# Patient Record
Sex: Female | Born: 1970 | Race: White | Hispanic: No | Marital: Married | Smoking: Current some day smoker
Health system: Southern US, Community
[De-identification: ages and names within clinical notes are randomized; demographics above are authoritative.]

## PROBLEM LIST (undated history)

## (undated) DIAGNOSIS — E119 Type 2 diabetes mellitus without complications: Secondary | ICD-10-CM

## (undated) DIAGNOSIS — F1121 Opioid dependence, in remission: Secondary | ICD-10-CM

## (undated) DIAGNOSIS — F319 Bipolar disorder, unspecified: Secondary | ICD-10-CM

## (undated) DIAGNOSIS — H4020X0 Unspecified primary angle-closure glaucoma, stage unspecified: Secondary | ICD-10-CM

## (undated) HISTORY — PX: TONSILLECTOMY: SUR1361

## (undated) HISTORY — PX: ECTOPIC PREGNANCY SURGERY: SHX613

---
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
## 1999-02-03 ENCOUNTER — Emergency Department (HOSPITAL_COMMUNITY): Admission: EM | Admit: 1999-02-03 | Discharge: 1999-02-04 | Payer: Self-pay | Admitting: Emergency Medicine

## 2013-09-10 ENCOUNTER — Emergency Department: Payer: Self-pay | Admitting: Emergency Medicine

## 2013-09-10 DIAGNOSIS — H4020X0 Unspecified primary angle-closure glaucoma, stage unspecified: Secondary | ICD-10-CM

## 2013-09-10 HISTORY — DX: Unspecified primary angle-closure glaucoma, stage unspecified: H40.20X0

## 2013-12-17 DIAGNOSIS — E669 Obesity, unspecified: Secondary | ICD-10-CM | POA: Insufficient documentation

## 2014-01-01 ENCOUNTER — Emergency Department: Payer: Self-pay | Admitting: Emergency Medicine

## 2014-01-04 LAB — BETA STREP CULTURE(ARMC)

## 2014-01-06 ENCOUNTER — Ambulatory Visit: Payer: Self-pay | Admitting: Physician Assistant

## 2014-02-04 ENCOUNTER — Emergency Department: Payer: Self-pay | Admitting: Emergency Medicine

## 2014-04-19 ENCOUNTER — Emergency Department: Payer: Self-pay | Admitting: Emergency Medicine

## 2014-04-19 LAB — URINALYSIS, COMPLETE
Bacteria: NONE SEEN
Bilirubin,UR: NEGATIVE
Blood: NEGATIVE
Glucose,UR: NEGATIVE mg/dL (ref 0–75)
Ketone: NEGATIVE
Leukocyte Esterase: NEGATIVE
NITRITE: NEGATIVE
PROTEIN: NEGATIVE
Ph: 6 (ref 4.5–8.0)
SPECIFIC GRAVITY: 1.018 (ref 1.003–1.030)
Squamous Epithelial: 2

## 2014-04-19 LAB — GC/CHLAMYDIA PROBE AMP

## 2014-04-19 LAB — WET PREP, GENITAL

## 2014-12-13 ENCOUNTER — Emergency Department
Admit: 2014-12-13 | Disposition: A | Discharge status: Home / Self Care | Payer: Self-pay | Admitting: Emergency Medicine

## 2015-07-15 ENCOUNTER — Encounter: Payer: Self-pay | Admitting: Emergency Medicine

## 2015-07-15 ENCOUNTER — Emergency Department
Admission: EM | Admit: 2015-07-15 | Discharge: 2015-07-15 | Disposition: A | Discharge status: Home / Self Care | Payer: Self-pay | Attending: Emergency Medicine | Admitting: Emergency Medicine

## 2015-07-15 DIAGNOSIS — R609 Edema, unspecified: Secondary | ICD-10-CM

## 2015-07-15 DIAGNOSIS — Z880 Allergy status to penicillin: Secondary | ICD-10-CM | POA: Insufficient documentation

## 2015-07-15 DIAGNOSIS — F419 Anxiety disorder, unspecified: Secondary | ICD-10-CM | POA: Insufficient documentation

## 2015-07-15 DIAGNOSIS — R600 Localized edema: Secondary | ICD-10-CM | POA: Insufficient documentation

## 2015-07-15 DIAGNOSIS — Z720 Tobacco use: Secondary | ICD-10-CM | POA: Insufficient documentation

## 2015-07-15 DIAGNOSIS — N390 Urinary tract infection, site not specified: Secondary | ICD-10-CM | POA: Insufficient documentation

## 2015-07-15 LAB — CBC
HEMATOCRIT: 43.9 % (ref 35.0–47.0)
HEMOGLOBIN: 14.3 g/dL (ref 12.0–16.0)
MCH: 28.9 pg (ref 26.0–34.0)
MCHC: 32.6 g/dL (ref 32.0–36.0)
MCV: 88.8 fL (ref 80.0–100.0)
Platelets: 192 10*3/uL (ref 150–440)
RBC: 4.95 MIL/uL (ref 3.80–5.20)
RDW: 13.5 % (ref 11.5–14.5)
WBC: 9.1 10*3/uL (ref 3.6–11.0)

## 2015-07-15 LAB — COMPREHENSIVE METABOLIC PANEL
ALBUMIN: 4.1 g/dL (ref 3.5–5.0)
ALK PHOS: 60 U/L (ref 38–126)
ALT: 156 U/L — AB (ref 14–54)
AST: 68 U/L — ABNORMAL HIGH (ref 15–41)
Anion gap: 4 — ABNORMAL LOW (ref 5–15)
BUN: 14 mg/dL (ref 6–20)
CALCIUM: 9.2 mg/dL (ref 8.9–10.3)
CO2: 24 mmol/L (ref 22–32)
CREATININE: 0.97 mg/dL (ref 0.44–1.00)
Chloride: 110 mmol/L (ref 101–111)
GFR calc Af Amer: >60 mL/min (ref >60)
GFR calc non Af Amer: >60 mL/min (ref >60)
GLUCOSE: 163 mg/dL — AB (ref 65–99)
Potassium: 4.6 mmol/L (ref 3.5–5.1)
SODIUM: 138 mmol/L (ref 135–145)
TOTAL PROTEIN: 7 g/dL (ref 6.5–8.1)
Total Bilirubin: 0.6 mg/dL (ref 0.3–1.2)

## 2015-07-15 LAB — URINALYSIS COMPLETE WITH MICROSCOPIC (ARMC ONLY)
BILIRUBIN URINE: NEGATIVE
Glucose, UA: 50 mg/dL — AB
Hgb urine dipstick: NEGATIVE
KETONES UR: NEGATIVE mg/dL
Leukocytes, UA: NEGATIVE
Nitrite: POSITIVE — AB
PROTEIN: NEGATIVE mg/dL
Specific Gravity, Urine: 1.021 (ref 1.005–1.030)
pH: 5 (ref 5.0–8.0)

## 2015-07-15 MED ORDER — CIPROFLOXACIN HCL 500 MG PO TABS: 500.0000 mg | ORAL_TABLET | Freq: Two times a day (BID) | ORAL | Status: DC

## 2015-07-15 NOTE — ED Provider Notes (Signed)
[facility] Emergency Department Provider Note  ____________________________________________  Time seen: On arrival  I have reviewed the triage vital signs and the nursing notes.   HISTORY  Chief Complaint Swollen hands and foul-smelling urine   HPI Yvonne Torres is a 44 y.o. female who presents with complaints of swelling in her hands that she has noticed intermittently for the last week. She reports her family has a history of kidney disease so she is concerned that her kidneys are failing. She also notes that her urine is dark and has an odor. She denies dysuria but does report frequency. She denies back pain or fevers or chills. No nausea or vomiting. No new medications. Denies drug use. Denies alcohol use.     History reviewed. No pertinent past medical history.  There are no active problems to display for this patient.   History reviewed. No pertinent past surgical history.  Current Outpatient Rx  Name  Route  Sig  Dispense  Refill  . ciprofloxacin (CIPRO) 500 MG tablet   Oral   Take 1 tablet (500 mg total) by mouth 2 (two) times daily.   14 tablet   0     Allergies Penicillins  No family history on file.  Social History Social History  Substance Use Topics  . Smoking status: Current Some Day Smoker  . Smokeless tobacco: None  . Alcohol Use: No    Review of Systems  Constitutional: Negative for fever. Eyes: Negative for visual changes. ENT: Negative for sore throat Cardiovascular: Negative for chest pain. Respiratory: Negative for shortness of breath. Gastrointestinal: Negative for abdominal pain, vomiting and diarrhea. Genitourinary: Negative for dysuria. Positive for foul-smelling urine Musculoskeletal: Negative for back pain. Skin: Negative for rash. Neurological: Negative for headaches or focal weakness Psychiatric: Mild anxiety    ____________________________________________   PHYSICAL EXAM:  VITAL  SIGNS: ED Triage Vitals  Enc Vitals Group     BP 07/15/15 1413 144/98 mmHg     Pulse Rate 07/15/15 1413 98     Resp 07/15/15 1413 18     Temp --      Temp src --      SpO2 07/15/15 1413 98 %     Weight 07/15/15 1413 222 lb (100.699 kg)     Height 07/15/15 1413  (1.626 m)     Head Cir --      Peak Flow --      Pain Score 07/15/15 1414 3     Pain Loc --      Pain Edu? --      Excl. in GC? --      Constitutional: Alert and oriented. Well appearing and in no distress. Eyes: Conjunctivae are normal.  ENT   Head: Normocephalic and atraumatic.   Mouth/Throat: Mucous membranes are moist. Cardiovascular: Normal rate, regular rhythm. Normal and symmetric distal pulses are present in all extremities. No murmurs, rubs, or gallops. Respiratory: Normal respiratory effort without tachypnea nor retractions. Breath sounds are clear and equal bilaterally.  Gastrointestinal: Soft and non-tender in all quadrants. No distention. There is no CVA tenderness. Genitourinary: deferred Musculoskeletal: Nontender with normal range of motion in all extremities. No upper or lower extremity edema noted. Hands appear normal to me Neurologic:  Normal speech and language. No gross focal neurologic deficits are appreciated. Skin:  Skin is warm, dry and intact. No rash noted. Psychiatric: Mood and affect are normal. Patient exhibits appropriate insight and judgment.  ____________________________________________    LABS (pertinent positives/negatives)  Labs Reviewed  URINALYSIS COMPLETEWITH MICROSCOPIC ([facility] ONLY) - Abnormal; Notable for the following:    Color, Urine YELLOW (*)    APPearance HAZY (*)    Glucose, UA 50 (*)    Nitrite POSITIVE (*)    Bacteria, UA RARE (*)    Squamous Epithelial / LPF 6-30 (*)    All other components within normal limits  COMPREHENSIVE METABOLIC PANEL - Abnormal; Notable for the following:    Glucose, Bld 163 (*)    AST 68 (*)    ALT 156 (*)    Anion gap 4  (*)    All other components within normal limits  CBC    ____________________________________________   EKG  None  ____________________________________________    RADIOLOGY I have personally reviewed any xrays that were ordered on this patient: None  ____________________________________________   PROCEDURES  Procedure(s) performed: none  Critical Care performed:none  ____________________________________________   INITIAL IMPRESSION / ASSESSMENT AND PLAN / ED COURSE  Pertinent labs & imaging results that were available during my care of the patient were reviewed by me and considered in my medical decision making (see chart for details).  Renal function unremarkable. Positive nitrites on urinalysis which we will treat with antibiotics. I do not see any edema but I recommended decrease salt intake and PCP follow-up.  ____________________________________________   FINAL CLINICAL IMPRESSION(S) / ED DIAGNOSES  Final diagnoses:  UTI (lower urinary tract infection)  Peripheral edema     Jene Everyobert Alice Vitelli, MD 07/15/15 2300

## 2015-07-15 NOTE — ED Notes (Signed)
Pt presents with swollen hands and dark urine with an odor.

## 2015-07-15 NOTE — Discharge Instructions (Signed)

## 2015-09-21 ENCOUNTER — Emergency Department: Payer: Self-pay

## 2015-09-21 ENCOUNTER — Emergency Department
Admission: EM | Admit: 2015-09-21 | Discharge: 2015-09-21 | Disposition: A | Discharge status: Home / Self Care | Payer: Self-pay | Attending: Emergency Medicine | Admitting: Emergency Medicine

## 2015-09-21 DIAGNOSIS — J209 Acute bronchitis, unspecified: Secondary | ICD-10-CM | POA: Insufficient documentation

## 2015-09-21 DIAGNOSIS — F17200 Nicotine dependence, unspecified, uncomplicated: Secondary | ICD-10-CM | POA: Insufficient documentation

## 2015-09-21 DIAGNOSIS — Z880 Allergy status to penicillin: Secondary | ICD-10-CM | POA: Insufficient documentation

## 2015-09-21 LAB — CBC
HEMATOCRIT: 42.4 % (ref 35.0–47.0)
HEMOGLOBIN: 13.8 g/dL (ref 12.0–16.0)
MCH: 28.2 pg (ref 26.0–34.0)
MCHC: 32.6 g/dL (ref 32.0–36.0)
MCV: 86.7 fL (ref 80.0–100.0)
Platelets: 216 10*3/uL (ref 150–440)
RBC: 4.89 MIL/uL (ref 3.80–5.20)
RDW: 13.2 % (ref 11.5–14.5)
WBC: 11.7 10*3/uL — AB (ref 3.6–11.0)

## 2015-09-21 LAB — BASIC METABOLIC PANEL
ANION GAP: 6 (ref 5–15)
BUN: 12 mg/dL (ref 6–20)
CALCIUM: 9.1 mg/dL (ref 8.9–10.3)
CO2: 23 mmol/L (ref 22–32)
Chloride: 109 mmol/L (ref 101–111)
Creatinine, Ser: 0.9 mg/dL (ref 0.44–1.00)
GLUCOSE: 141 mg/dL — AB (ref 65–99)
POTASSIUM: 3.7 mmol/L (ref 3.5–5.1)
Sodium: 138 mmol/L (ref 135–145)

## 2015-09-21 LAB — TROPONIN I

## 2015-09-21 MED ORDER — AZITHROMYCIN 250 MG PO TABS: ORAL_TABLET | ORAL | Status: DC

## 2015-09-21 MED ORDER — PREDNISONE 20 MG PO TABS
60.0000 mg | ORAL_TABLET | Freq: Once | ORAL | Status: AC
  Administered 2015-09-21: 60 mg via ORAL

## 2015-09-21 MED ORDER — HYDROCOD POLST-CPM POLST ER 10-8 MG/5ML PO SUER
5.0000 mL | Freq: Once | ORAL | Status: AC
  Administered 2015-09-21: 5 mL via ORAL

## 2015-09-21 MED ORDER — IPRATROPIUM-ALBUTEROL 0.5-2.5 (3) MG/3ML IN SOLN
RESPIRATORY_TRACT | Status: AC
  Administered 2015-09-21: 3 mL via RESPIRATORY_TRACT
  Filled 2015-09-21: qty 3

## 2015-09-21 MED ORDER — PREDNISONE 20 MG PO TABS
ORAL_TABLET | ORAL | Status: AC
  Administered 2015-09-21: 60 mg via ORAL
  Filled 2015-09-21: qty 3

## 2015-09-21 MED ORDER — IPRATROPIUM-ALBUTEROL 0.5-2.5 (3) MG/3ML IN SOLN
3.0000 mL | Freq: Once | RESPIRATORY_TRACT | Status: AC
  Administered 2015-09-21: 3 mL via RESPIRATORY_TRACT
  Filled 2015-09-21: qty 3

## 2015-09-21 MED ORDER — HYDROCOD POLST-CPM POLST ER 10-8 MG/5ML PO SUER
ORAL | Status: AC
  Administered 2015-09-21: 5 mL via ORAL
  Filled 2015-09-21: qty 5

## 2015-09-21 MED ORDER — ALBUTEROL SULFATE 0.63 MG/3ML IN NEBU: 1.0000 | INHALATION_SOLUTION | Freq: Four times a day (QID) | RESPIRATORY_TRACT | Status: DC | PRN

## 2015-09-21 MED ORDER — BENZONATATE 200 MG PO CAPS: 200.0000 mg | ORAL_CAPSULE | Freq: Three times a day (TID) | ORAL | Status: DC | PRN

## 2015-09-21 MED ORDER — PREDNISONE 10 MG (21) PO TBPK: 10.0000 mg | ORAL_TABLET | Freq: Every day | ORAL | Status: DC

## 2015-09-21 NOTE — ED Notes (Addendum)
Pt appears anxious, pt admits that she is anxious.

## 2015-09-21 NOTE — ED Provider Notes (Signed)
[facility] Emergency Department Provider Note     Time seen: ----------------------------------------- 4:35 PM on 09/21/2015 -----------------------------------------    I have reviewed the triage vital signs and the nursing notes.   HISTORY  Chief Complaint Shortness of Breath    HPI Yvonne Torres is a 45 y.o. female who presents to ER with shortness of breath since she inhaled some papers from an [employer], operational. Patient states shortness of breath has worsened over the last 3-4 days and she has increased cough with green sputum production. She denies any fevers chills but does have diarrhea.   History reviewed. No pertinent past medical history.  There are no active problems to display for this patient.   History reviewed. No pertinent past surgical history.  Allergies Penicillins  Social History Social History  Substance Use Topics  . Smoking status: Current Some Day Smoker  . Smokeless tobacco: None  . Alcohol Use: No    Review of Systems Constitutional: Negative for fever. Eyes: Negative for visual changes. ENT: Negative for sore throat. Cardiovascular: Negative for chest pain. Respiratory: Positive for shortness of breath and cough Gastrointestinal: Negative for abdominal pain, vomiting, positive for diarrhea Genitourinary: Negative for dysuria. Musculoskeletal: Negative for back pain. Skin: Negative for rash. Neurological: Negative for headaches, focal weakness or numbness.  10-point ROS otherwise negative.  ____________________________________________   PHYSICAL EXAM:  VITAL SIGNS: ED Triage Vitals  Enc Vitals Group     BP 09/21/15 1600 140/85 mmHg     Pulse Rate 09/21/15 1600 109     Resp 09/21/15 1600 22     Temp 09/21/15 1600 98.5 F (36.9 C)     Temp Source 09/21/15 1600 Oral     SpO2 09/21/15 1600 98 %     Weight 09/21/15 1600 220 lb (99.791 kg)     Height 09/21/15 1600 5\' 4"  (1.626 m)     Head  Cir --      Peak Flow --      Pain Score 09/21/15 1601 4     Pain Loc --      Pain Edu? --      Excl. in GC? --     Constitutional: Alert and oriented. Well appearing and in no distress. Eyes: Conjunctivae are normal. PERRL. Normal extraocular movements. ENT   Head: Normocephalic and atraumatic.   Nose: No congestion/rhinnorhea.   Mouth/Throat: Mucous membranes are moist.   Neck: No stridor. Cardiovascular: Normal rate, regular rhythm. Normal and symmetric distal pulses are present in all extremities. No murmurs, rubs, or gallops. Respiratory: Normal respiratory effort without tachypnea nor retractions. Mild wheezing and rhonchi bilaterally. Gastrointestinal: Soft and nontender. No distention. No abdominal bruits.  Musculoskeletal: Nontender with normal range of motion in all extremities. No joint effusions.  No lower extremity tenderness nor edema. Neurologic:  Normal speech and language. No gross focal neurologic deficits are appreciated. Speech is normal. No gait instability. Skin:  Skin is warm, dry and intact. No rash noted. Psychiatric: Mood and affect are normal. Speech and behavior are normal. Patient exhibits appropriate insight and judgment. ____________________________________________  EKG: Interpreted by me. Sinus tachycardia with rate of 109 bpm, normal PR interval, normal QRS, normal QT interval. Normal axis.  ____________________________________________  ED COURSE:  Pertinent labs & imaging results that were available during my care of the patient were reviewed by me and considered in my medical decision making (see chart for details). Patient is in no acute distress, will check basic labs, chest x-ray and give  a DuoNeb. ____________________________________________    LABS (pertinent positives/negatives)  Labs Reviewed  CBC - Abnormal; Notable for the following:    WBC 11.7 (*)    All other components within normal limits  BASIC METABOLIC PANEL -  Abnormal; Notable for the following:    Glucose, Bld 141 (*)    All other components within normal limits  TROPONIN I    RADIOLOGY  Chest x-ray  IMPRESSION: No active cardiopulmonary disease. ____________________________________________  FINAL ASSESSMENT AND PLAN  Bronchitis  Plan: Patient with labs and imaging as dictated above. Patient with increased wheezing after breathing treatment here. She'll be discharged with albuterol solution as she has a nebulizer machine at home, should be on a steroid taper and given oral antibiotics. She is stable for outpatient follow-up with her doctor.   Emily FilbertWilliams, Jonathan E, MD   Emily FilbertJonathan E Williams, MD 09/21/15 [phone]

## 2015-09-21 NOTE — ED Notes (Signed)
Pt arrives c/o SOB since she had her oil changed 09/05/15. Pt states SOB worsening X 3-4 days. Pt productive cough, green. Denies fever. Pt has not been seen by a doctor. Pt alert and oriented X4, active, cooperative, pt in NAD. RR even and unlabored, color WNL.

## 2015-09-21 NOTE — Discharge Instructions (Signed)

## 2015-09-23 ENCOUNTER — Encounter: Payer: Self-pay | Admitting: Emergency Medicine

## 2015-09-23 ENCOUNTER — Emergency Department
Admission: EM | Admit: 2015-09-23 | Discharge: 2015-09-23 | Disposition: A | Discharge status: Home / Self Care | Payer: Self-pay | Attending: Emergency Medicine | Admitting: Emergency Medicine

## 2015-09-23 DIAGNOSIS — F17210 Nicotine dependence, cigarettes, uncomplicated: Secondary | ICD-10-CM | POA: Insufficient documentation

## 2015-09-23 DIAGNOSIS — T483X5A Adverse effect of antitussives, initial encounter: Secondary | ICD-10-CM | POA: Insufficient documentation

## 2015-09-23 DIAGNOSIS — J209 Acute bronchitis, unspecified: Secondary | ICD-10-CM | POA: Insufficient documentation

## 2015-09-23 DIAGNOSIS — J40 Bronchitis, not specified as acute or chronic: Secondary | ICD-10-CM

## 2015-09-23 DIAGNOSIS — L500 Allergic urticaria: Secondary | ICD-10-CM | POA: Insufficient documentation

## 2015-09-23 DIAGNOSIS — Z79899 Other long term (current) drug therapy: Secondary | ICD-10-CM | POA: Insufficient documentation

## 2015-09-23 DIAGNOSIS — Z7952 Long term (current) use of systemic steroids: Secondary | ICD-10-CM | POA: Insufficient documentation

## 2015-09-23 DIAGNOSIS — Z792 Long term (current) use of antibiotics: Secondary | ICD-10-CM | POA: Insufficient documentation

## 2015-09-23 DIAGNOSIS — I10 Essential (primary) hypertension: Secondary | ICD-10-CM | POA: Insufficient documentation

## 2015-09-23 DIAGNOSIS — T50905A Adverse effect of unspecified drugs, medicaments and biological substances, initial encounter: Secondary | ICD-10-CM

## 2015-09-23 MED ORDER — EPINEPHRINE 0.3 MG/0.3ML IJ SOAJ: 0.3000 mg | Freq: Once | INTRAMUSCULAR | Status: AC

## 2015-09-23 MED ORDER — PREDNISONE 20 MG PO TABS
60.0000 mg | ORAL_TABLET | Freq: Once | ORAL | Status: AC
  Administered 2015-09-23: 60 mg via ORAL
  Filled 2015-09-23: qty 3

## 2015-09-23 MED ORDER — IPRATROPIUM-ALBUTEROL 0.5-2.5 (3) MG/3ML IN SOLN
9.0000 mL | Freq: Once | RESPIRATORY_TRACT | Status: AC
  Administered 2015-09-23: 9 mL via RESPIRATORY_TRACT
  Filled 2015-09-23: qty 9

## 2015-09-23 MED ORDER — DIPHENHYDRAMINE HCL 25 MG PO TABS: 25.0000 mg | ORAL_TABLET | Freq: Four times a day (QID) | ORAL | Status: DC | PRN

## 2015-09-23 NOTE — ED Provider Notes (Signed)
[facility] Emergency Department Provider Note  ____________________________________________  Time seen: 10:05 AM  I have reviewed the triage vital signs and the nursing notes.   HISTORY  Chief Complaint Allergic Reaction    HPI Yvonne Torres is a 45 y.o. female brought to the ED from work due to skin itching and worsened shortness of breath after taking a Tessalon Perle today. She's been treated with acute bronchitis recently and is taking albuterol prednisone azithromycin and Tessalon. She is not taking any medicines this morning except for Tessalon. No known allergies except anaphylaxis to penicillin. Denies vomiting or throat swelling. Shortness of breath feels like her bronchitis. Was given IV Benadryl by EMS prehospital with resolution of her skin itching and rash.     Past Medical History  Diagnosis Date  . Hypertension      There are no active problems to display for this patient.    Past Surgical History  Procedure Laterality Date  . Ectopic pregnancy surgery    . Tonsillectomy       Current Outpatient Rx  Name  Route  Sig  Dispense  Refill  . albuterol (ACCUNEB) 0.63 MG/3ML nebulizer solution   Nebulization   Take 3 mLs (0.63 mg total) by nebulization every 6 (six) hours as needed for wheezing.   75 mL   12   . azithromycin (ZITHROMAX Z-PAK) 250 MG tablet      Take 2 tablets (500 mg) on  Day 1,  followed by 1 tablet (250 mg) once daily on Days 2 through 5.   6 each   0   . benzonatate (TESSALON) 200 MG capsule   Oral   Take 1 capsule (200 mg total) by mouth 3 (three) times daily as needed for cough.   20 capsule   0   . LamoTRIgine 50 MG TBDP   Oral   Take 50 mg by mouth 3 (three) times daily.         . predniSONE (STERAPRED UNI-PAK 21 TAB) 10 MG (21) TBPK tablet   Oral   Take 1 tablet (10 mg total) by mouth daily. Dispense steroid taper pak as directed Patient taking differently: Take 10-60 mg by mouth See  admin instructions. 60 mg on day 1, then 50 mg on day 2, then 40 mg on day 3, then 30 mg on day 4, then 20 mg on day 5, then 10 mg on day 6, then stop   21 tablet   0   . ciprofloxacin (CIPRO) 500 MG tablet   Oral   Take 1 tablet (500 mg total) by mouth 2 (two) times daily. Patient not taking: Reported on 09/23/2015   14 tablet   0   . diphenhydrAMINE (BENADRYL) 25 MG tablet   Oral   Take 1 tablet (25 mg total) by mouth every 6 (six) hours as needed.   30 tablet   0   . EPINEPHrine 0.3 mg/0.3 mL IJ SOAJ injection   Intramuscular   Inject 0.3 mLs (0.3 mg total) into the muscle once. Follow package instructions as needed for severe allergy or anaphylactic reaction.   1 Device   2      Allergies Penicillins   No family history on file.  Social History Social History  Substance Use Topics  . Smoking status: Current Some Day Smoker -- 0.25 packs/day    Types: Cigarettes  . Smokeless tobacco: None  . Alcohol Use: No    Review of Systems  Constitutional:  No fever or chills. No weight changes Eyes:   No blurry vision or double vision.  ENT:   Positive sore throat. Cardiovascular:   No chest pain. Respiratory:   Positive shortness of breath and nonproductive cough Gastrointestinal:   Negative for abdominal pain, vomiting and diarrhea.  No BRBPR or melena. Genitourinary:   Negative for dysuria, urinary retention, bloody urine, or difficulty urinating. Musculoskeletal:   Negative for back pain. No joint swelling or pain. Skin:   Hives. Neurological:   Negative for headaches, focal weakness or numbness. Psychiatric:  No anxiety or depression.   Endocrine:  No hot/cold intolerance, changes in energy, or sleep difficulty.  10-point ROS otherwise negative.  ____________________________________________   PHYSICAL EXAM:  VITAL SIGNS: ED Triage Vitals  Enc Vitals Group     BP --      Pulse --      Resp --      Temp --      Temp src --      SpO2 --      Weight  --      Height --      Head Cir --      Peak Flow --      Pain Score 09/23/15 1009 5     Pain Loc --      Pain Edu? --      Excl. in GC? --     Vital signs reviewed, nursing assessments reviewed.   Constitutional:   Alert and oriented. Well appearing and in no distress. Eyes:   No scleral icterus. No conjunctival pallor. PERRL. EOMI ENT   Head:   Normocephalic and atraumatic.   Nose:   No congestion/rhinnorhea. No septal hematoma   Mouth/Throat:   MMM, mild pharyngeal erythema. No peritonsillar mass. No uvula shift.   Neck:   No stridor. No SubQ emphysema. No meningismus. Hematological/Lymphatic/Immunilogical:   No cervical lymphadenopathy. Cardiovascular:   RRR. Normal and symmetric distal pulses are present in all extremities. No murmurs, rubs, or gallops. Respiratory:   Normal respiratory effort without tachypnea nor retractions. Diffuse expiratory wheezing with coughing provoked by deep inspiration. Good air entry in all fields. Gastrointestinal:   Soft and nontender. No distention. There is no CVA tenderness.  No rebound, rigidity, or guarding. Genitourinary:   deferred Musculoskeletal:   Nontender with normal range of motion in all extremities. No joint effusions.  No lower extremity tenderness.  No edema. Neurologic:   Normal speech and language.  CN 2-10 normal. Motor grossly intact. No pronator drift.  Normal gait. No gross focal neurologic deficits are appreciated.  Skin:    Skin is warm, dry and intact. No rash noted.  No petechiae, purpura, or bullae. Psychiatric:   Mood and affect are normal. Speech and behavior are normal. Patient exhibits appropriate insight and judgment.  ____________________________________________    LABS (pertinent positives/negatives) (all labs ordered are listed, but only abnormal results are displayed) Labs Reviewed - No data to  display ____________________________________________   EKG    ____________________________________________    RADIOLOGY    ____________________________________________   PROCEDURES   ____________________________________________   INITIAL IMPRESSION / ASSESSMENT AND PLAN / ED COURSE  Pertinent labs & imaging results that were available during my care of the patient were reviewed by me and considered in my medical decision making (see chart for details).  Patient presents with wheezing and cutaneous skin reaction to likely Tessalon. It's possible this is related to other medicines that she took yesterday although the most  contemporaneous 1 is the Tessalon. No known allergies. Counseled to stop Tessalon. We'll have her take Benadryl and prescribed EpiPen, continue other medications for management of her bronchitis. We'll watch in the ED. We'll give nebs and oral prednisone for now.  ----------------------------------------- 12:25 PM on 09/23/2015 -----------------------------------------  Continued improvement throughout ED stay. Wheezing resolved. Feels well and wants to go home. No evidence of anaphylaxis or recurrent allergic reaction. We'll continue on Benadryl and steroids, follow up with primary care.     ____________________________________________   FINAL CLINICAL IMPRESSION(S) / ED DIAGNOSES  Final diagnoses:  Bronchitis  Drug reaction, initial encounter      Sharman CheekPhillip Ocean Schildt, MD 09/23/15 1225

## 2015-09-23 NOTE — ED Notes (Signed)
Patient [facility] ED [facility].  Patient was recently seen in the ED and diagnosed with bronchitis and given a prescription for tessalon pearls.  Patient took one pill this morning at 8:15am and then at 8:30am patient reports feeling a burning sensation in her chest and in her tongue.  Patient was given 50mg  of benadryl IV by EMS.  Patient has wheezing bilaterally.  Patient is in no obvious distress at this time.

## 2015-09-23 NOTE — Discharge Instructions (Signed)
Drug Allergy °Allergic reactions to medicines are common. Some allergic reactions are mild. A delayed type of drug allergy that occurs 1 week or more after exposure to a medicine or vaccine is called serum sickness. A life-threatening, sudden (acute) allergic reaction that involves the whole body is called anaphylaxis. °CAUSES  °"True" drug allergies occur when there is an allergic reaction to a medicine. This is caused by overactivity of the immune system. First, the body becomes sensitized. The immune system is triggered by your first exposure to the medicine. Following this first exposure, future exposure to the same medicine may be life-threatening. °Almost any medicine can cause an allergic reaction. Common ones are: °· Penicillin. °· Sulfonamides (sulfa drugs). °· Local anesthetics. °· X-ray dyes that contain iodine. °SYMPTOMS  °Common symptoms of a minor allergic reaction are: °· Swelling around the mouth. °· An itchy red rash or hives. °· Vomiting or diarrhea. °Anaphylaxis can cause swelling of the mouth and throat. This makes it difficult to breathe and swallow. Severe reactions can be fatal within seconds, even after exposure to only a trace amount of the drug that causes the reaction. °HOME CARE INSTRUCTIONS °· If you are unsure of what caused your reaction, write down: °¨ The names of the medicines you took. °¨ How much medicine you took. °¨ How you took the medicine, such as whether you took a pill, injected the medicine, or applied it to your skin. °¨ All of the things you ate and drank. °¨ The date and time of your reaction. °¨ The symptoms of the reaction. °· You may want to follow up with an allergy specialist after the reaction has cleared in order to be tested to confirm the allergy. It is important to confirm that your reaction is an allergy, not just a side effect to the medicine. If you have a true allergy to a medicine, this may prevent that medicine and related medicines from being given to  you when you are very ill. °· If you have hives or a rash: °¨ Take medicines as directed by your caregiver. °¨ You may use an over-the-counter antihistamine (diphenhydramine) as needed. °¨ Apply cold compresses to the skin or take baths in cool water. Avoid hot baths or showers. °· If you are severely allergic: °¨ Continuous observation after a severe reaction may be needed. Hospitalization is often required. °¨ Wear a medical alert bracelet or necklace stating your allergy. °¨ You and your family must learn how to use an anaphylaxis kit or give an epinephrine injection to temporarily treat an emergency allergic reaction. If you have had a severe reaction, always carry your epinephrine injection or anaphylaxis kit with you. This can be lifesaving if you have a severe reaction. °· Do not drive or perform tasks after treatment until the medicines used to treat your reaction have worn off, or until your caregiver says it is okay. °· If you have a drug allergy that was confirmed by your health care provider: °¨ Carry information about the drug allergy with you at all times. °¨ Always check with a pharmacist before taking any over-the-counter medicine. °SEEK MEDICAL CARE IF:  °· You think you had an allergic reaction. Symptoms usually start within 30 minutes after exposure. °· Symptoms are getting worse rather than better. °· You develop new symptoms. °· The symptoms that brought you to your caregiver return. °SEEK IMMEDIATE MEDICAL CARE IF:  °· You have swelling of the mouth, difficulty breathing, or wheezing. °· You have a tight   feeling in your chest or throat.  You develop hives, swelling, or itching all over your body.  You develop severe vomiting or diarrhea.  You feel faint or pass out. This is an emergency. Use your epinephrine injection or anaphylaxis kit as you have been instructed. Call for emergency medical help. Even if you improve after the injection, you need to be examined at a hospital emergency  department. MAKE SURE YOU:   Understand these instructions.  Will watch your condition.  Will get help right away if you are not doing well or get worse.   This information is not intended to replace advice given to you by your health care provider. Make sure you discuss any questions you have with your health care provider.   Document Released: 08/27/2005 Document Revised: 09/17/2014 Document Reviewed: 03/29/2015 Elsevier Interactive Patient Education 2016 [employer]  Upper Respiratory Infection, Adult Most upper respiratory infections (URIs) are a viral infection of the air passages leading to the lungs. A URI affects the nose, throat, and upper air passages. The most common type of URI is nasopharyngitis and is typically referred to as "the common cold." URIs run their course and usually go away on their own. Most of the time, a URI does not require medical attention, but sometimes a bacterial infection in the upper airways can follow a viral infection. This is called a secondary infection. Sinus and middle ear infections are common types of secondary upper respiratory infections. Bacterial pneumonia can also complicate a URI. A URI can worsen asthma and chronic obstructive pulmonary disease (COPD). Sometimes, these complications can require emergency medical care and may be life threatening.  CAUSES Almost all URIs are caused by viruses. A virus is a type of germ and can spread from one person to another.  RISKS FACTORS You may be at risk for a URI if:   You smoke.   You have chronic heart or lung disease.  You have a weakened defense (immune) system.   You are very young or very old.   You have nasal allergies or asthma.  You work in crowded or poorly ventilated areas.  You work in health care facilities or schools. SIGNS AND SYMPTOMS  Symptoms typically develop 2-3 days after you come in contact with a cold virus. Most viral URIs last 7-10 days. However, viral URIs  from the influenza virus (flu virus) can last 14-18 days and are typically more severe. Symptoms may include:   Runny or stuffy (congested) nose.   Sneezing.   Cough.   Sore throat.   Headache.   Fatigue.   Fever.   Loss of appetite.   Pain in your forehead, behind your eyes, and over your cheekbones (sinus pain).  Muscle aches.  DIAGNOSIS  Your health care provider may diagnose a URI by:  Physical exam.  Tests to check that your symptoms are not due to another condition such as:  Strep throat.  Sinusitis.  Pneumonia.  Asthma. TREATMENT  A URI goes away on its own with time. It cannot be cured with medicines, but medicines may be prescribed or recommended to relieve symptoms. Medicines may help:  Reduce your fever.  Reduce your cough.  Relieve nasal congestion. HOME CARE INSTRUCTIONS   Take medicines only as directed by your health care provider.   Gargle warm saltwater or take cough drops to comfort your throat as directed by your health care provider.  Use a warm mist humidifier or inhale steam from a shower to increase air  moisture. This may make it easier to breathe.  Drink enough fluid to keep your urine clear or pale yellow.   Eat soups and other clear broths and maintain good nutrition.   Rest as needed.   Return to work when your temperature has returned to normal or as your health care provider advises. You may need to stay home longer to avoid infecting others. You can also use a face mask and careful hand washing to prevent spread of the virus.  Increase the usage of your inhaler if you have asthma.   Do not use any tobacco products, including cigarettes, chewing tobacco, or electronic cigarettes. If you need help quitting, ask your health care provider. PREVENTION  The best way to protect yourself from getting a cold is to practice good hygiene.   Avoid oral or hand contact with people with cold symptoms.   Wash your hands  often if contact occurs.  There is no clear evidence that vitamin C, vitamin E, echinacea, or exercise reduces the chance of developing a cold. However, it is always recommended to get plenty of rest, exercise, and practice good nutrition.  SEEK MEDICAL CARE IF:   You are getting worse rather than better.   Your symptoms are not controlled by medicine.   You have chills.  You have worsening shortness of breath.  You have brown or red mucus.  You have yellow or brown nasal discharge.  You have pain in your face, especially when you bend forward.  You have a fever.  You have swollen neck glands.  You have pain while swallowing.  You have white areas in the back of your throat. SEEK IMMEDIATE MEDICAL CARE IF:   You have severe or persistent:  Headache.  Ear pain.  Sinus pain.  Chest pain.  You have chronic lung disease and any of the following:  Wheezing.  Prolonged cough.  Coughing up blood.  A change in your usual mucus.  You have a stiff neck.  You have changes in your:  Vision.  Hearing.  Thinking.  Mood. MAKE SURE YOU:   Understand these instructions.  Will watch your condition.  Will get help right away if you are not doing well or get worse.   This information is not intended to replace advice given to you by your health care provider. Make sure you discuss any questions you have with your health care provider.   Document Released: 02/20/2001 Document Revised: 01/11/2015 Document Reviewed: 12/02/2013 Elsevier Interactive Patient Education [employer].

## 2015-09-27 ENCOUNTER — Telehealth: Payer: Self-pay | Admitting: Emergency Medicine

## 2015-09-27 NOTE — ED Notes (Signed)
Patient called and says she needs a note to says she can be released to go back to work as she came here in ambulance from her work.  i advised her to go to [facility] for re eval and ask for a note saying she is able to work.  She will do that. Pt also asked about the epipen rx we gave her. She says it is >$600 and says she was told that [facility] has the adrenaclick pen for 110 dollars.  i spoke with [facility] [address] and explained that the adrenaclick can be substituted for the epipen for this patient.  They will call the patient as they have to order the item.

## 2015-12-07 ENCOUNTER — Encounter: Payer: Self-pay | Admitting: Emergency Medicine

## 2015-12-07 ENCOUNTER — Emergency Department
Admission: EM | Admit: 2015-12-07 | Discharge: 2015-12-07 | Disposition: A | Discharge status: Home / Self Care | Payer: Self-pay | Attending: Emergency Medicine | Admitting: Emergency Medicine

## 2015-12-07 DIAGNOSIS — L237 Allergic contact dermatitis due to plants, except food: Secondary | ICD-10-CM | POA: Insufficient documentation

## 2015-12-07 DIAGNOSIS — Z79899 Other long term (current) drug therapy: Secondary | ICD-10-CM | POA: Insufficient documentation

## 2015-12-07 DIAGNOSIS — I10 Essential (primary) hypertension: Secondary | ICD-10-CM | POA: Insufficient documentation

## 2015-12-07 DIAGNOSIS — F17210 Nicotine dependence, cigarettes, uncomplicated: Secondary | ICD-10-CM | POA: Insufficient documentation

## 2015-12-07 MED ORDER — PREDNISONE 10 MG PO TABS: ORAL_TABLET | ORAL | Status: DC

## 2015-12-07 MED ORDER — DEXAMETHASONE SODIUM PHOSPHATE 10 MG/ML IJ SOLN
INTRAMUSCULAR | Status: AC
  Administered 2015-12-07: 4 mg
  Filled 2015-12-07: qty 1

## 2015-12-07 MED ORDER — DEXAMETHASONE SODIUM PHOSPHATE 4 MG/ML IJ SOLN
4.0000 mg | Freq: Once | INTRAMUSCULAR | Status: DC
  Filled 2015-12-07: qty 1

## 2015-12-07 MED ORDER — HYDROXYZINE PAMOATE 25 MG PO CAPS: 25.0000 mg | ORAL_CAPSULE | Freq: Three times a day (TID) | ORAL | Status: DC | PRN

## 2015-12-07 NOTE — Discharge Instructions (Signed)
Contact Dermatitis Dermatitis is redness, soreness, and swelling (inflammation) of the skin. Contact dermatitis is a reaction to certain substances that touch the skin. There are two types of contact dermatitis:   Irritant contact dermatitis. This type is caused by something that irritates your skin, such as dry hands from washing them too much. This type does not require previous exposure to the substance for a reaction to occur. This type is more common.  Allergic contact dermatitis. This type is caused by a substance that you are allergic to, such as a nickel allergy or poison ivy. This type only occurs if you have been exposed to the substance (allergen) before. Upon a repeat exposure, your body reacts to the substance. This type is less common. CAUSES  Many different substances can cause contact dermatitis. Irritant contact dermatitis is most commonly caused by exposure to:   Makeup.   Soaps.   Detergents.   Bleaches.   Acids.   Metal salts, such as nickel.  Allergic contact dermatitis is most commonly caused by exposure to:   Poisonous plants.   Chemicals.   Jewelry.   Latex.   Medicines.   Preservatives in products, such as clothing.  RISK FACTORS This condition is more likely to develop in:   People who have jobs that expose them to irritants or allergens.  People who have certain medical conditions, such as asthma or eczema.  SYMPTOMS  Symptoms of this condition may occur anywhere on your body where the irritant has touched you or is touched by you. Symptoms include:  Dryness or flaking.   Redness.   Cracks.   Itching.   Pain or a burning feeling.   Blisters.  Drainage of small amounts of blood or clear fluid from skin cracks. With allergic contact dermatitis, there may also be swelling in areas such as the eyelids, mouth, or genitals.  DIAGNOSIS  This condition is diagnosed with a medical history and physical exam. A patch skin test  may be performed to help determine the cause. If the condition is related to your job, you may need to see an occupational medicine specialist. TREATMENT Treatment for this condition includes figuring out what caused the reaction and protecting your skin from further contact. Treatment may also include:   Steroid creams or ointments. Oral steroid medicines may be needed in more severe cases.  Antibiotics or antibacterial ointments, if a skin infection is present.  Antihistamine lotion or an antihistamine taken by mouth to ease itching.  A bandage (dressing). HOME CARE INSTRUCTIONS Skin Care  Moisturize your skin as needed.   Apply cool compresses to the affected areas.  Try taking a bath with:  Epsom salts. Follow the instructions on the packaging. You can get these at your local pharmacy or grocery store.  Baking soda. Pour a small amount into the bath as directed by your health care provider.  Colloidal oatmeal. Follow the instructions on the packaging. You can get this at your local pharmacy or grocery store.  Try applying baking soda paste to your skin. Stir water into baking soda until it reaches a paste-like consistency.  Do not scratch your skin.  Bathe less frequently, such as every other day.  Bathe in lukewarm water. Avoid using hot water. Medicines  Take or apply over-the-counter and prescription medicines only as told by your health care provider.   If you were prescribed an antibiotic medicine, take or apply your antibiotic as told by your health care provider. Do not stop using the   antibiotic even if your condition starts to improve. General Instructions  Keep all follow-up visits as told by your health care provider. This is important.  Avoid the substance that caused your reaction. If you do not know what caused it, keep a journal to try to track what caused it. Write down:  What you eat.  What cosmetic products you use.  What you drink.  What  you wear in the affected area. This includes jewelry.  If you were given a dressing, take care of it as told by your health care provider. This includes when to change and remove it. SEEK MEDICAL CARE IF:   Your condition does not improve with treatment.  Your condition gets worse.  You have signs of infection such as swelling, tenderness, redness, soreness, or warmth in the affected area.  You have a fever.  You have new symptoms. SEEK IMMEDIATE MEDICAL CARE IF:   You have a severe headache, neck pain, or neck stiffness.  You vomit.  You feel very sleepy.  You notice red streaks coming from the affected area.  Your bone or joint underneath the affected area becomes painful after the skin has healed.  The affected area turns darker.  You have difficulty breathing.   This information is not intended to replace advice given to you by your health care provider. Make sure you discuss any questions you have with your health care provider.   Document Released: 08/24/2000 Document Revised: 05/18/2015 Document Reviewed: 01/12/2015 Elsevier Interactive Patient Education 2016 Elsevier Inc.  

## 2015-12-07 NOTE — ED Notes (Signed)
States she developed a rash couple of days ago  Has been exposed to poison oak. Areas noted to arms ,face and buttocks

## 2015-12-07 NOTE — ED Provider Notes (Signed)
[facility] Emergency Department Provider Note  ____________________________________________  Time seen: Approximately 5:54 PM  I have reviewed the triage vital signs and the nursing notes.   HISTORY  Chief Complaint Rash    HPI Yvonne Torres is a 45 y.o. female presents for evaluation of a rash 2 days. She reports that she was handling poison oak and touched her arms or face and her buttocks. Denies any difficulty breathing or chest pains or shortness of breath. Concern about facial rash spreading to her eyes.   Past Medical History  Diagnosis Date  . Hypertension     There are no active problems to display for this patient.   Past Surgical History  Procedure Laterality Date  . Ectopic pregnancy surgery    . Tonsillectomy      Current Outpatient Rx  Name  Route  Sig  Dispense  Refill  . albuterol (ACCUNEB) 0.63 MG/3ML nebulizer solution   Nebulization   Take 3 mLs (0.63 mg total) by nebulization every 6 (six) hours as needed for wheezing.   75 mL   12   . diphenhydrAMINE (BENADRYL) 25 MG tablet   Oral   Take 1 tablet (25 mg total) by mouth every 6 (six) hours as needed.   30 tablet   0   . EPINEPHrine 0.3 mg/0.3 mL IJ SOAJ injection   Intramuscular   Inject 0.3 mLs (0.3 mg total) into the muscle once. Follow package instructions as needed for severe allergy or anaphylactic reaction.   1 Device   2   . hydrOXYzine (VISTARIL) 25 MG capsule   Oral   Take 1 capsule (25 mg total) by mouth 3 (three) times daily as needed.   30 capsule   0   . LamoTRIgine 50 MG TBDP   Oral   Take 50 mg by mouth 3 (three) times daily.         . predniSONE (DELTASONE) 10 MG tablet      Take 3 tablets twice daily x 4 days, then, take 2 tablets twice daily x 4 days, then take 1 tablet twice daily x 4 days   48 tablet   0     Allergies Penicillins  No family history on file.  Social History Social History  Substance Use Topics  .  Smoking status: Current Some Day Smoker -- 0.25 packs/day    Types: Cigarettes  . Smokeless tobacco: None  . Alcohol Use: No    Review of Systems Constitutional: No fever/chills Cardiovascular: Denies chest pain. Respiratory: Denies shortness of breath. Skin: Positive for rash. Neurological: Negative for headaches, focal weakness or numbness.  10-point ROS otherwise negative.  ____________________________________________   PHYSICAL EXAM:  VITAL SIGNS: ED Triage Vitals  Enc Vitals Group     BP 12/07/15 1743 163/89 mmHg     Pulse Rate 12/07/15 1743 87     Resp 12/07/15 1743 16     Temp 12/07/15 1743 97.8 F (36.6 C)     Temp Source 12/07/15 1743 Oral     SpO2 12/07/15 1743 99 %     Weight 12/07/15 1743 213 lb (96.616 kg)     Height 12/07/15 1743 5\' 4"  (1.626 m)     Head Cir --      Peak Flow --      Pain Score --      Pain Loc --      Pain Edu? --      Excl. in GC? --  Constitutional: Alert and oriented. Well appearing and in no acute distress.  Cardiovascular: Normal rate, regular rhythm. Grossly normal heart sounds.  Good peripheral circulation. Respiratory: Normal respiratory effort.  No retractions. Lungs CTAB. Skin:  Skin is warm, dry and intact. Positive erythematous macular rash consistent with contact dermatitis. Psychiatric: Mood and affect are normal. Speech and behavior are normal.  ____________________________________________   LABS (all labs ordered are listed, but only abnormal results are displayed)  Labs Reviewed - No data to display ____________________________________________   PROCEDURES  Procedure(s) performed: None  Critical Care performed: No  ____________________________________________   INITIAL IMPRESSION / ASSESSMENT AND PLAN / ED COURSE  Pertinent labs & imaging results that were available during my care of the patient were reviewed by me and considered in my medical decision making (see chart for details).  Acute  contact dermatitis specifically wheezing though. Rx given for Decadron 4 mg IM follow-up with 12 days of prednisone and hydroxyzine for itching. ____________________________________________   FINAL CLINICAL IMPRESSION(S) / ED DIAGNOSES  Final diagnoses:  Poison oak dermatitis     This chart was dictated using voice recognition software/Dragon. Despite best efforts to proofread, errors can occur which can change the meaning. Any change was purely unintentional.   Evangeline Dakin, PA-C 12/07/15 1823

## 2015-12-07 NOTE — ED Notes (Signed)
Pt reports poison oak rash to bilateral arms, face and buttocks.

## 2016-08-20 ENCOUNTER — Encounter: Payer: Self-pay | Admitting: Emergency Medicine

## 2016-08-20 ENCOUNTER — Emergency Department
Admission: EM | Admit: 2016-08-20 | Discharge: 2016-08-20 | Disposition: A | Discharge status: Home / Self Care | Payer: Self-pay | Attending: Emergency Medicine | Admitting: Emergency Medicine

## 2016-08-20 DIAGNOSIS — J0100 Acute maxillary sinusitis, unspecified: Secondary | ICD-10-CM | POA: Insufficient documentation

## 2016-08-20 DIAGNOSIS — Z79899 Other long term (current) drug therapy: Secondary | ICD-10-CM | POA: Insufficient documentation

## 2016-08-20 DIAGNOSIS — F17210 Nicotine dependence, cigarettes, uncomplicated: Secondary | ICD-10-CM | POA: Insufficient documentation

## 2016-08-20 MED ORDER — IBUPROFEN 600 MG PO TABS: 600.0000 mg | ORAL_TABLET | Freq: Three times a day (TID) | ORAL | 0 refills | Status: DC | PRN

## 2016-08-20 MED ORDER — PSEUDOEPH-BROMPHEN-DM 30-2-10 MG/5ML PO SYRP: 5.0000 mL | ORAL_SOLUTION | Freq: Four times a day (QID) | ORAL | 0 refills | Status: DC | PRN

## 2016-08-20 MED ORDER — SULFAMETHOXAZOLE-TRIMETHOPRIM 800-160 MG PO TABS: 1.0000 | ORAL_TABLET | Freq: Two times a day (BID) | ORAL | 0 refills | Status: DC

## 2016-08-20 NOTE — ED Triage Notes (Signed)
Started with sinus congestion and cough last Wednesday. C/o green drainage down back of throat. Reports fevers up to 101 over last week. Pain to frontal and maxillary sinuses.

## 2016-08-20 NOTE — ED Provider Notes (Signed)
[facility] Emergency Department Provider Note   ____________________________________________   First MD Initiated Contact with Patient 08/20/16 1227     (approximate)  I have reviewed the triage vital signs and the nursing notes.   HISTORY  Chief Complaint Nasal Congestion    HPI Yvonne Torres is a 45 y.o. female patient complaining of 5 days of sinus congestion, ear pressure, and productive cough. Patient also states copious green postnasal drainage and a throat. Patient reported fever with this complaint. Patient stated no relief over-the-counter cold and flu preparations.Patient rates the pain as a 5/10. Patient described her pain as "pressure".   History reviewed. No pertinent past medical history.  There are no active problems to display for this patient.   Past Surgical History:  Procedure Laterality Date  . ECTOPIC PREGNANCY SURGERY    . TONSILLECTOMY      Prior to Admission medications   Medication Sig Start Date End Date Taking? Authorizing Provider  albuterol (ACCUNEB) 0.63 MG/3ML nebulizer solution Take 3 mLs (0.63 mg total) by nebulization every 6 (six) hours as needed for wheezing. 09/21/15   Emily FilbertJonathan E Williams, MD  brompheniramine-pseudoephedrine-DM 30-2-10 MG/5ML syrup Take 5 mLs by mouth 4 (four) times daily as needed. 08/20/16   Joni Reiningonald K Smith, PA-C  diphenhydrAMINE (BENADRYL) 25 MG tablet Take 1 tablet (25 mg total) by mouth every 6 (six) hours as needed. 09/23/15   Sharman CheekPhillip Stafford, MD  EPINEPHrine 0.3 mg/0.3 mL IJ SOAJ injection Inject 0.3 mLs (0.3 mg total) into the muscle once. Follow package instructions as needed for severe allergy or anaphylactic reaction. 09/23/15   Sharman CheekPhillip Stafford, MD  hydrOXYzine (VISTARIL) 25 MG capsule Take 1 capsule (25 mg total) by mouth 3 (three) times daily as needed. 12/07/15   Charmayne Sheerharles M Beers, PA-C  ibuprofen (ADVIL,MOTRIN) 600 MG tablet Take 1 tablet (600 mg total) by mouth every 8 (eight)  hours as needed. 08/20/16   Joni Reiningonald K Smith, PA-C  LamoTRIgine 50 MG TBDP Take 50 mg by mouth 3 (three) times daily.    Historical Provider, MD  predniSONE (DELTASONE) 10 MG tablet Take 3 tablets twice daily x 4 days, then, take 2 tablets twice daily x 4 days, then take 1 tablet twice daily x 4 days 12/07/15   Charmayne Sheerharles M Beers, PA-C  sulfamethoxazole-trimethoprim (BACTRIM DS,SEPTRA DS) 800-160 MG tablet Take 1 tablet by mouth 2 (two) times daily. 08/20/16   Joni Reiningonald K Smith, PA-C    Allergies Penicillins  History reviewed. No pertinent family history.  Social History Social History  Substance Use Topics  . Smoking status: Current Some Day Smoker    Packs/day: 0.25    Types: Cigarettes  . Smokeless tobacco: Not on file  . Alcohol use No    Review of Systems Constitutional: Fever and chills Eyes: No visual changes. ENT: Sore throat. Nasal congestion Cardiovascular: Denies chest pain. Respiratory: Denies shortness of breath. Productive cough Gastrointestinal: No abdominal pain.  No nausea, no vomiting.  No diarrhea.  No constipation. Genitourinary: Negative for dysuria. Musculoskeletal: Negative for back pain. Skin: Negative for rash. Neurological: Positive for headaches, but denies focal weakness or numbness. Allergic/Immunilogical: Penicillin  ____________________________________________   PHYSICAL EXAM:  VITAL SIGNS: ED Triage Vitals  Enc Vitals Group     BP 08/20/16 1150 (!) 141/75     Pulse Rate 08/20/16 1148 100     Resp 08/20/16 1148 18     Temp 08/20/16 1148 98.1 F (36.7 C)     Temp Source 08/20/16  1148 Oral     SpO2 08/20/16 1148 98 %     Weight 08/20/16 1148 215 lb (97.5 kg)     Height 08/20/16 1148 5\' 4"  (1.626 m)     Head Circumference --      Peak Flow --      Pain Score 08/20/16 1148 7     Pain Loc --      Pain Edu? --      Excl. in GC? --     Constitutional: Alert and oriented. Well appearing and in no acute distress. Eyes: Conjunctivae are normal.  PERRL. EOMI. Head: Atraumatic. Nose: Nasal congestion and sinus pressure Mouth/Throat: Mucous membranes are moist.  Oropharynx non-erythematous. Neck: No stridor.  No cervical spine tenderness to palpation. Hematological/Lymphatic/Immunilogical: No cervical lymphadenopathy. Cardiovascular: Normal rate, regular rhythm. Grossly normal heart sounds.  Good peripheral circulation. Respiratory: Normal respiratory effort.  No retractions. Lungs CTAB. Productive cough.. Gastrointestinal: Soft and nontender. No distention. No abdominal bruits. No CVA tenderness. Musculoskeletal: No lower extremity tenderness nor edema.  No joint effusions. Neurologic:  Normal speech and language. No gross focal neurologic deficits are appreciated. No gait instability. Skin:  Skin is warm, dry and intact. No rash noted. Psychiatric: Mood and affect are normal. Speech and behavior are normal.  ____________________________________________   LABS (all labs ordered are listed, but only abnormal results are displayed)  Labs Reviewed - No data to display ____________________________________________  EKG   ____________________________________________  RADIOLOGY   ____________________________________________   PROCEDURES  Procedure(s) performed: None  Procedures  Critical Care performed: No  ____________________________________________   INITIAL IMPRESSION / ASSESSMENT AND PLAN / ED COURSE  Pertinent labs & imaging results that were available during my care of the patient were reviewed by me and considered in my medical decision making (see chart for details).  Sinusitis and cough. Patient given discharge care instructions. Patient is given a prescription for Bactrim DS, ibuprofen and Bromfed-DM. [facility]  Clinical Course      ____________________________________________   FINAL CLINICAL IMPRESSION(S) / ED DIAGNOSES  Final diagnoses:   Subacute maxillary sinusitis      NEW MEDICATIONS STARTED DURING THIS VISIT:  New Prescriptions   BROMPHENIRAMINE-PSEUDOEPHEDRINE-DM 30-2-10 MG/5ML SYRUP    Take 5 mLs by mouth 4 (four) times daily as needed.   IBUPROFEN (ADVIL,MOTRIN) 600 MG TABLET    Take 1 tablet (600 mg total) by mouth every 8 (eight) hours as needed.   SULFAMETHOXAZOLE-TRIMETHOPRIM (BACTRIM DS,SEPTRA DS) 800-160 MG TABLET    Take 1 tablet by mouth 2 (two) times daily.     Note:  This document was prepared using Dragon voice recognition software and may include unintentional dictation errors.    Joni ReiningRonald K Smith, PA-C 08/20/16 1234    Sharman CheekPhillip Stafford, MD 08/21/16 [phone]

## 2016-08-20 NOTE — ED Notes (Signed)
See triage note  Sinus pressure  And cough since alst weds  Had temp last week  But afebrile on arrival

## 2016-11-22 ENCOUNTER — Emergency Department
Admission: EM | Admit: 2016-11-22 | Discharge: 2016-11-22 | Disposition: A | Discharge status: Home / Self Care | Payer: Self-pay | Attending: Emergency Medicine | Admitting: Emergency Medicine

## 2016-11-22 DIAGNOSIS — Z79899 Other long term (current) drug therapy: Secondary | ICD-10-CM | POA: Insufficient documentation

## 2016-11-22 DIAGNOSIS — B349 Viral infection, unspecified: Secondary | ICD-10-CM | POA: Insufficient documentation

## 2016-11-22 DIAGNOSIS — F17210 Nicotine dependence, cigarettes, uncomplicated: Secondary | ICD-10-CM | POA: Insufficient documentation

## 2016-11-22 NOTE — ED Provider Notes (Signed)
[facility] Emergency Department Provider Note   ____________________________________________   First MD Initiated Contact with Patient 11/22/16 1056     (approximate)  I have reviewed the triage vital signs and the nursing notes.   HISTORY  Chief Complaint Fever; Sore Throat; and Diarrhea    HPI Yvonne Torres is a 46 y.o. female is here with complaint of fever, sore throat and diarrhea that began yesterday. She also is here with 3 grandchildren with the same symptoms. She states that she is night she taken her temperature. She denies any vomiting but states that the diarrhea yesterday was moderate amount.  Patient rates her pain as a 3 out of 10. She is not taking any over-the-counter medications morning prior to her arrival. She has continued to drink fluids without any difficulty. She denies any headache or ear pain.   No past medical history on file.  There are no active problems to display for this patient.   Past Surgical History:  Procedure Laterality Date  . ECTOPIC PREGNANCY SURGERY    . TONSILLECTOMY      Prior to Admission medications   Medication Sig Start Date End Date Taking? Authorizing Provider  diphenhydrAMINE (BENADRYL) 25 MG tablet Take 1 tablet (25 mg total) by mouth every 6 (six) hours as needed. 09/23/15   Sharman Cheek, MD  EPINEPHrine 0.3 mg/0.3 mL IJ SOAJ injection Inject 0.3 mLs (0.3 mg total) into the muscle once. Follow package instructions as needed for severe allergy or anaphylactic reaction. 09/23/15   Sharman Cheek, MD  LamoTRIgine 50 MG TBDP Take 50 mg by mouth 3 (three) times daily.    Historical Provider, MD    Allergies Penicillins  No family history on file.  Social History Social History  Substance Use Topics  . Smoking status: Current Some Day Smoker    Packs/day: 0.25    Types: Cigarettes  . Smokeless tobacco: Not on file  . Alcohol use No    Review of Systems Constitutional: No  fever/chills Eyes: No visual changes. ENT: No sore throat. Cardiovascular: Denies chest pain. Respiratory: Denies shortness of breath. Gastrointestinal: No abdominal pain.  No nausea, no vomiting.  Positive diarrhea.   Genitourinary: Negative for dysuria. Musculoskeletal: Negative for back pain. Skin: Negative for rash. Neurological: Negative for headaches, focal weakness or numbness.  10-point ROS otherwise negative.  ____________________________________________   PHYSICAL EXAM:  VITAL SIGNS: ED Triage Vitals  Enc Vitals Group     BP 11/22/16 1029 135/70     Pulse Rate 11/22/16 1029 86     Resp 11/22/16 1029 20     Temp 11/22/16 1029 98.2 F (36.8 C)     Temp Source 11/22/16 1029 Oral     SpO2 11/22/16 1029 97 %     Weight 11/22/16 1030 213 lb (96.6 kg)     Height 11/22/16 1030 5\' 4"  (1.626 m)     Head Circumference --      Peak Flow --      Pain Score 11/22/16 1039 3     Pain Loc --      Pain Edu? --      Excl. in GC? --     Constitutional: Alert and oriented. Well appearing and in no acute distress. Eyes: Conjunctivae are normal. PERRL. EOMI. Head: Atraumatic. Nose: No congestion/rhinnorhea.  EACs and TMs are clear bilaterally. Mouth/Throat: Mucous membranes are moist.  Oropharynx non-erythematous. Neck: No stridor.  Hematological/Lymphatic/Immunilogical: No cervical lymphadenopathy. Cardiovascular: Normal rate, regular rhythm. Grossly normal  heart sounds.  Good peripheral circulation. Respiratory: Normal respiratory effort.  No retractions. Lungs CTAB. Gastrointestinal: Soft and nontender. No distention. Sounds normoactive 4 quadrants. Musculoskeletal: Moves upper and lower extremities without any difficulty. Normal gait was noted. Neurologic:  Normal speech and language. No gross focal neurologic deficits are appreciated. No gait instability. Skin:  Skin is warm, dry and intact. No rash noted. Psychiatric: Mood and affect are normal. Speech and behavior are  normal.  ____________________________________________   LABS (all labs ordered are listed, but only abnormal results are displayed)  Labs Reviewed - No data to display  PROCEDURES  Procedure(s) performed: None  Procedures  Critical Care performed: No  ____________________________________________   INITIAL IMPRESSION / ASSESSMENT AND PLAN / ED COURSE  Pertinent labs & imaging results that were available during my care of the patient were reviewed by me and considered in my medical decision making (see chart for details).  Patient was made aware that most likely this is a viral illness since she and 4 granddaughters all have the same symptoms. Patient is to increase fluids, take Tylenol as needed for pain or fever and follow-up with her primary care if any continued problems. She was told that because of the diarrhea she should advance her diet slowly from clear liquids to avoid      ____________________________________________   FINAL CLINICAL IMPRESSION(S) / ED DIAGNOSES  Final diagnoses:  Viral illness      NEW MEDICATIONS STARTED DURING THIS VISIT:  Discharge Medication List as of 11/22/2016 12:51 PM       Note:  This document was prepared using Dragon voice recognition software and may include unintentional dictation errors.    Tommi RumpsRhonda L Malcome Ambrocio, PA-C 11/22/16 1451    Governor Rooksebecca Lord, MD 11/22/16 1534

## 2016-11-22 NOTE — ED Triage Notes (Signed)
Pt reports fever, sore throat and diarrhea that began yesterday. Pt here with three grandchildren all with same symptoms.

## 2016-11-22 NOTE — ED Notes (Signed)
See triage note  States she developed fever  With sore throat and diarrhea about 2 days ago  States all of her grandchildren have same sx's

## 2016-11-22 NOTE — Discharge Instructions (Signed)
Clear liquids for the next 24 hours. Gradually add back bland foods to prevent continued diarrhea. Tylenol as needed for fever. [facility] any [facility] problems or your primary care doctor.

## 2017-06-13 ENCOUNTER — Encounter: Payer: Self-pay | Admitting: Emergency Medicine

## 2017-06-13 ENCOUNTER — Emergency Department
Admission: EM | Admit: 2017-06-13 | Discharge: 2017-06-13 | Disposition: A | Discharge status: Home / Self Care | Payer: Self-pay | Attending: Emergency Medicine | Admitting: Emergency Medicine

## 2017-06-13 ENCOUNTER — Emergency Department: Payer: Self-pay

## 2017-06-13 DIAGNOSIS — F17210 Nicotine dependence, cigarettes, uncomplicated: Secondary | ICD-10-CM | POA: Insufficient documentation

## 2017-06-13 DIAGNOSIS — R079 Chest pain, unspecified: Secondary | ICD-10-CM | POA: Insufficient documentation

## 2017-06-13 LAB — CBC
HCT: 40.9 % (ref 35.0–47.0)
Hemoglobin: 13.9 g/dL (ref 12.0–16.0)
MCH: 29.7 pg (ref 26.0–34.0)
MCHC: 34 g/dL (ref 32.0–36.0)
MCV: 87.4 fL (ref 80.0–100.0)
PLATELETS: 227 10*3/uL (ref 150–440)
RBC: 4.68 MIL/uL (ref 3.80–5.20)
RDW: 13.7 % (ref 11.5–14.5)
WBC: 9.5 10*3/uL (ref 3.6–11.0)

## 2017-06-13 LAB — BASIC METABOLIC PANEL
Anion gap: 7 (ref 5–15)
BUN: 14 mg/dL (ref 6–20)
CALCIUM: 9 mg/dL (ref 8.9–10.3)
CO2: 24 mmol/L (ref 22–32)
CREATININE: 0.73 mg/dL (ref 0.44–1.00)
Chloride: 109 mmol/L (ref 101–111)
Glucose, Bld: 156 mg/dL — ABNORMAL HIGH (ref 65–99)
Potassium: 3.9 mmol/L (ref 3.5–5.1)
SODIUM: 140 mmol/L (ref 135–145)

## 2017-06-13 LAB — TROPONIN I

## 2017-06-13 MED ORDER — METOCLOPRAMIDE HCL 5 MG/ML IJ SOLN
10.0000 mg | Freq: Once | INTRAMUSCULAR | Status: AC
  Administered 2017-06-13: 10 mg via INTRAVENOUS
  Filled 2017-06-13: qty 2

## 2017-06-13 MED ORDER — LORAZEPAM 1 MG PO TABS: 1.0000 mg | ORAL_TABLET | Freq: Three times a day (TID) | ORAL | 0 refills | Status: DC | PRN

## 2017-06-13 MED ORDER — KETOROLAC TROMETHAMINE 30 MG/ML IJ SOLN
30.0000 mg | Freq: Once | INTRAMUSCULAR | Status: AC
  Administered 2017-06-13: 30 mg via INTRAVENOUS
  Filled 2017-06-13: qty 1

## 2017-06-13 MED ORDER — LORAZEPAM 2 MG/ML IJ SOLN
0.5000 mg | Freq: Once | INTRAMUSCULAR | Status: AC
  Administered 2017-06-13: 0.5 mg via INTRAVENOUS
  Filled 2017-06-13: qty 1

## 2017-06-13 NOTE — ED Notes (Signed)
After triage was complete patient states, "You know what? I am having thoughts of wanting to hurt myself". Patient denies plan. Patient crying and texting on phone. Patient states, "even when I'm in the hospital they wont stop". First nurse, Zollie Scale made aware as well as Press photographer, Maxine Glenn.

## 2017-06-13 NOTE — ED Notes (Signed)
Pt denies thoughts of hurting herself or others.

## 2017-06-13 NOTE — ED Notes (Signed)
Pt arrives via EMS with complaints of chest pain, EMS states pt very anxious upon arrival,  Crying, 197/157 CBG 178, 95% RA, 1 inch of nitro past applied, 143/97, 324 ASA and 4 mg zofran given PTA

## 2017-06-13 NOTE — ED Triage Notes (Signed)
Patient presents to ED via [facility] with c/o CP that began 1000 this morning. Patient arrives with nitro to chest and of NS infusing. Patient states, "I don't know if I had a panic attack or not".

## 2017-06-13 NOTE — ED Provider Notes (Signed)
[facility] Emergency Department Provider Note       Time seen: ----------------------------------------- 7:02 PM on 06/13/2017 -----------------------------------------     I have reviewed the triage vital signs and the nursing notes.   HISTORY   Chief Complaint Chest Pain    HPI Yvonne Torres is a 46 y.o. female who presents to the ED for chest pain began at 10:00 this morning. Patient arrives with Dr. Michaelle Birks and paste to her chest and having received 500 cc normal saline. Patient states she's not sure if she had a panic attack or not. Since that period time she's had severe headache 8 out of 10 that began after nitroglycerin was placed. She denies fevers, chills, has had recent cough but denies other symptoms such as vomiting or diarrhea.  History reviewed. No pertinent past medical history.  There are no active problems to display for this patient.   Past Surgical History:  Procedure Laterality Date  . ECTOPIC PREGNANCY SURGERY    . TONSILLECTOMY      Allergies Penicillins  Social History Social History  Substance Use Topics  . Smoking status: Current Some Day Smoker    Packs/day: 0.25    Types: Cigarettes  . Smokeless tobacco: Not on file  . Alcohol use No    Review of Systems Constitutional: Negative for fever. Eyes: Negative for vision changes ENT:  Negative for congestion, sore throat Cardiovascular: positive for chest pain Respiratory: Negative for shortness of breath. Gastrointestinal: Negative for abdominal pain, vomiting and diarrhea. Genitourinary: Negative for dysuria. Musculoskeletal: Negative for back pain. Skin: positive for rash Neurological: positive for headache  All systems negative/normal/unremarkable except as stated in the HPI  ____________________________________________   PHYSICAL EXAM:  VITAL SIGNS: ED Triage Vitals  Enc Vitals Group     BP --      Pulse --      Resp --      Temp --    Temp src --      SpO2 --      Weight 06/13/17 1628 213 lb (96.6 kg)     Height 06/13/17 1628  (1.676 m)     Head Circumference --      Peak Flow --      Pain Score 06/13/17 1825 8     Pain Loc --      Pain Edu? --      Excl. in GC? --    Constitutional: Alert and oriented. anxious, no distress Eyes: Conjunctivae are normal. Normal extraocular movements. ENT   Head: Normocephalic and atraumatic.   Nose: No congestion/rhinnorhea.   Mouth/Throat: Mucous membranes are moist.   Neck: No stridor. Cardiovascular: Normal rate, regular rhythm. No murmurs, rubs, or gallops. Respiratory: Normal respiratory effort without tachypnea nor retractions. Breath sounds are clear and equal bilaterally. No wheezes/rales/rhonchi. Gastrointestinal: Soft and nontender. Normal bowel sounds Musculoskeletal: Nontender with normal range of motion in extremities. No lower extremity tenderness nor edema. Neurologic:  Normal speech and language. No gross focal neurologic deficits are appreciated.  Skin: irregular erythematous rash on the trunk and extremities. Most resembles urticarial lesions Psychiatric: Mood and affect are normal. Speech and behavior are normal.  ____________________________________________  EKG: Interpreted by me.sinus rhythm rate of 90 bpm, normal QRS size, normal QT.  ____________________________________________  ED COURSE:  Pertinent labs & imaging results that were available during my care of the patient were reviewed by me and considered in my medical decision making (see chart for details). Patient presents for chest pain  and headache, we will assess with labs and imaging as indicated.   Procedures ____________________________________________   LABS (pertinent positives/negatives)  Labs Reviewed  BASIC METABOLIC PANEL - Abnormal; Notable for the following:       Result Value   Glucose, Bld 156 (*)    All other components within normal limits  CBC  TROPONIN  I  TROPONIN I    RADIOLOGY  chest x-ray is normal  ____________________________________________  DIFFERENTIAL DIAGNOSIS   unstable angina, panic attack, PE, muscle strain, GERD, post nitroglycerin headache  FINAL ASSESSMENT AND PLAN  chest pain, headache   Plan: Patient's labs and imaging were dictated above. Patient had presented for chest pain which appears to be more anxiety related than any other obvious etiology. Repeat troponin here was negative. Initial EKG had limb lead reversal but the true EKG was normal. She is stable for outpatient follow-up for recheck.   Emily Filbert, MD   Note: This note was generated in part or whole with voice recognition software. Voice recognition is usually quite accurate but there are transcription errors that can and very often do occur. I apologize for any typographical errors that were not detected and corrected.     Emily Filbert, MD 06/13/17 2016

## 2017-06-13 NOTE — ED Notes (Signed)
Pt discharged to home.  Family member driving.  Discharge instructions reviewed.  Verbalized understanding.  No questions or concerns at this time.  Teach back verified.  Pt in NAD.  No items left in ED.   

## 2017-08-16 DIAGNOSIS — R87619 Unspecified abnormal cytological findings in specimens from cervix uteri: Secondary | ICD-10-CM | POA: Insufficient documentation

## 2017-09-10 DIAGNOSIS — F1121 Opioid dependence, in remission: Secondary | ICD-10-CM

## 2017-09-10 HISTORY — DX: Opioid dependence, in remission: F11.21

## 2017-10-16 ENCOUNTER — Ambulatory Visit: Payer: Self-pay | Attending: Oncology | Admitting: *Deleted

## 2017-10-16 ENCOUNTER — Ambulatory Visit
Admission: RE | Admit: 2017-10-16 | Discharge: 2017-10-16 | Disposition: A | Discharge status: Home / Self Care | Payer: Self-pay | Source: Ambulatory Visit | Attending: Oncology | Admitting: Oncology

## 2017-10-16 VITALS — BP 136/89 | HR 86 | Temp 98.0°F | Ht 65.0 in | Wt 224.0 lb

## 2017-10-16 DIAGNOSIS — Z0000 Encounter for general adult medical examination without abnormal findings: Secondary | ICD-10-CM

## 2017-10-16 NOTE — Progress Notes (Signed)
Subjective:     Patient ID: Yvonne Torres, female   DOB: 1971-09-02, 47 y.o.   MRN: [id number]  HPI   Review of Systems     Objective:   Physical Exam  Pulmonary/Chest: Right breast exhibits no inverted nipple, no mass, no nipple discharge, no skin change and no tenderness. Left breast exhibits no inverted nipple, no mass, no nipple discharge, no skin change and no tenderness. Breasts are asymmetrical.  Right breast larger than the left       Assessment:     47 year old White female referred [facility] for clinical breast exam and mammogram only.  Last pap on 08/06/17 was negative / negative.  Next pap due in 5 years.  Clinical breast exam unremarkable.  Taught self breast awareness.  Patient has been screened for eligibility.  She does not have any insurance, Medicare or Medicaid.  She also meets financial eligibility.  Hand-out given on the Affordable Care Act.    Plan:     Screening mammogram ordered.  Will follow-up per [facility] protocol.

## 2017-10-16 NOTE — Patient Instructions (Signed)
Gave patient hand-out, Women Staying Healthy, Active and Well from [facility], with education on breast health, pap smears, heart and colon health. 

## 2017-10-17 ENCOUNTER — Encounter: Payer: Self-pay | Admitting: *Deleted

## 2017-10-17 NOTE — Progress Notes (Signed)
Letter mailed from the [facility] to inform patient of her normal mammogram results.  Patient is to follow-up with annual screening in one year.  HSIS to [place].  CC'd [facility] to [facility].

## 2018-02-10 ENCOUNTER — Other Ambulatory Visit: Payer: Self-pay

## 2018-02-10 ENCOUNTER — Encounter: Payer: Self-pay | Admitting: *Deleted

## 2018-02-10 ENCOUNTER — Emergency Department
Admission: EM | Admit: 2018-02-10 | Discharge: 2018-02-10 | Disposition: A | Discharge status: Home / Self Care | Payer: Self-pay | Attending: Emergency Medicine | Admitting: Emergency Medicine

## 2018-02-10 DIAGNOSIS — F17210 Nicotine dependence, cigarettes, uncomplicated: Secondary | ICD-10-CM | POA: Insufficient documentation

## 2018-02-10 DIAGNOSIS — R5383 Other fatigue: Secondary | ICD-10-CM | POA: Insufficient documentation

## 2018-02-10 DIAGNOSIS — Z79899 Other long term (current) drug therapy: Secondary | ICD-10-CM | POA: Insufficient documentation

## 2018-02-10 HISTORY — DX: Opioid dependence, in remission: F11.21

## 2018-02-10 LAB — CBC
HCT: 35.8 % (ref 35.0–47.0)
HEMOGLOBIN: 12 g/dL (ref 12.0–16.0)
MCH: 28.2 pg (ref 26.0–34.0)
MCHC: 33.5 g/dL (ref 32.0–36.0)
MCV: 84.3 fL (ref 80.0–100.0)
Platelets: 164 10*3/uL (ref 150–440)
RBC: 4.24 MIL/uL (ref 3.80–5.20)
RDW: 16.3 % — ABNORMAL HIGH (ref 11.5–14.5)
WBC: 7.2 10*3/uL (ref 3.6–11.0)

## 2018-02-10 LAB — URINALYSIS, COMPLETE (UACMP) WITH MICROSCOPIC
BILIRUBIN URINE: NEGATIVE
Bacteria, UA: NONE SEEN
HGB URINE DIPSTICK: NEGATIVE
Ketones, ur: NEGATIVE mg/dL
Leukocytes, UA: NEGATIVE
NITRITE: NEGATIVE
Protein, ur: NEGATIVE mg/dL
SPECIFIC GRAVITY, URINE: 1.016 (ref 1.005–1.030)
pH: 6 (ref 5.0–8.0)

## 2018-02-10 LAB — BASIC METABOLIC PANEL
ANION GAP: 9 (ref 5–15)
BUN: 15 mg/dL (ref 6–20)
CALCIUM: 9 mg/dL (ref 8.9–10.3)
CO2: 23 mmol/L (ref 22–32)
Chloride: 105 mmol/L (ref 101–111)
Creatinine, Ser: 0.76 mg/dL (ref 0.44–1.00)
GLUCOSE: 211 mg/dL — AB (ref 65–99)
Potassium: 4.2 mmol/L (ref 3.5–5.1)
Sodium: 137 mmol/L (ref 135–145)

## 2018-02-10 LAB — POCT PREGNANCY, URINE: PREG TEST UR: NEGATIVE

## 2018-02-10 NOTE — ED Notes (Signed)
Pt c/o of weakness and feeling sluggish. Pt states that she is having shooting pain going to her joints from time to time but it is not constant. Pt also c/o of dizziness but denies LOC, nausea, or vomiting.

## 2018-02-10 NOTE — ED Notes (Signed)
poct pregnancy Negative 

## 2018-02-10 NOTE — ED Provider Notes (Signed)
[facility] Emergency Department Provider Note  ____________________________________________   First MD Initiated Contact with Patient 02/10/18 1631     (approximate)  I have reviewed the triage vital signs and the nursing notes.   HISTORY  Chief Complaint Generalized Body Aches    HPI Yvonne Torres is a 47 y.o. female with no contributory past medical history but who has no primary care provider who presents for evaluation of fatigue.  She reports that she has been very fatigued and "sleepy" for a couple of weeks now, but she states that she has felt worn out even for months before that.  She reports every now and then having a sharp stabbing pain to various parts of her body but it is not consistent and nothing in particular makes her symptoms better or worse.  She states that she feels great since she got out of a residential treatment program for narcotics abuse.  She is not depressed and is enjoying being clean.  She is well tanned and has recently been to the beach and is spending time with friends, but she is concerned about her fatigue.  She describes it as severe and chronic, gradually developing, but she has no specific other symptoms.  She has had no chest pain or abdominal pain or shortness of breath.  She denies fever/chills, unexplained weight loss, dysuria.  Past Medical History:  Diagnosis Date  . Opioid dependence in remission [facility]) 2019   Reports she went through residential treatment program and is clean as of June 2019    There are no active problems to display for this patient.   Past Surgical History:  Procedure Laterality Date  . ECTOPIC PREGNANCY SURGERY    . TONSILLECTOMY      Prior to Admission medications   Medication Sig Start Date End Date Taking? Authorizing Provider  diphenhydrAMINE (BENADRYL) 25 MG tablet Take 1 tablet (25 mg total) by mouth every 6 (six) hours as needed. 09/23/15   Sharman Cheek, MD  EPINEPHrine  0.3 mg/0.3 mL IJ SOAJ injection Inject 0.3 mLs (0.3 mg total) into the muscle once. Follow package instructions as needed for severe allergy or anaphylactic reaction. 09/23/15   Sharman Cheek, MD  LamoTRIgine 50 MG TBDP Take 50 mg by mouth 3 (three) times daily.    [provider]  LORazepam (ATIVAN) 1 MG tablet Take 1 tablet (1 mg total) by mouth every 8 (eight) hours as needed for anxiety. 06/13/17 06/13/18  Emily Filbert, MD    Allergies Penicillins  Family History  Problem Relation Age of Onset  . Breast cancer Mother 12    Social History Social History   Tobacco Use  . Smoking status: Current Some Day Smoker    Packs/day: 0.25    Types: Cigarettes  . Smokeless tobacco: Never Used  Substance Use Topics  . Alcohol use: No  . Drug use: Not on file    Review of Systems Constitutional: Fatigue and "sleepiness".  No fever/chills Eyes: No visual changes. ENT: No sore throat. Cardiovascular: Denies chest pain. Respiratory: Denies shortness of breath. Gastrointestinal: No abdominal pain.  No nausea, no vomiting.  No diarrhea.  No constipation. Genitourinary: Negative for dysuria. Musculoskeletal: Negative for neck pain.  Negative for back pain.  Intermittent "random" sharp stabbing pains to various parts of her body, usually arms or legs. Integumentary: Negative for rash. Neurological: Negative for headaches, focal weakness or numbness.   ____________________________________________   PHYSICAL EXAM:  VITAL SIGNS: ED Triage Vitals  Enc Vitals Group     BP 02/10/18 1533 136/62     Pulse Rate 02/10/18 1533 82     Resp 02/10/18 1533 18     Temp 02/10/18 1533 98.6 F (37 C)     Temp Source 02/10/18 1533 Oral     SpO2 02/10/18 1533 96 %     Weight 02/10/18 1535 95.7 kg (211 lb)     Height 02/10/18 1535 1.6 m (5\' 3" )     Head Circumference --      Peak Flow --      Pain Score 02/10/18 1535 6     Pain Loc --      Pain Edu? --      Excl. in GC? --      Constitutional: Alert and oriented. Well appearing and in no acute distress. Eyes: Conjunctivae are normal. PERRL. EOMI. Head: Atraumatic. Nose: No congestion/rhinnorhea. Mouth/Throat: Mucous membranes are moist. Neck: No stridor.  No meningeal signs.   Cardiovascular: Normal rate, regular rhythm. Good peripheral circulation. Grossly normal heart sounds. Respiratory: Normal respiratory effort.  No retractions. Lungs CTAB. Gastrointestinal: Soft and nontender. No distention.  Musculoskeletal: No lower extremity tenderness nor edema. No gross deformities of extremities. Neurologic:  Normal speech and language. No gross focal neurologic deficits are appreciated.  Skin:  Skin is warm, dry and intact. No rash noted.  She is well tanned and is obviously been spending time recently outside. Psychiatric: Mood and affect are normal. Speech and behavior are normal.  ____________________________________________   LABS (all labs ordered are listed, but only abnormal results are displayed)  Labs Reviewed  BASIC METABOLIC PANEL - Abnormal; Notable for the following components:      Result Value   Glucose, Bld 211 (*)    All other components within normal limits  CBC - Abnormal; Notable for the following components:   RDW 16.3 (*)    All other components within normal limits  URINALYSIS, COMPLETE (UACMP) WITH MICROSCOPIC - Abnormal; Notable for the following components:   Color, Urine YELLOW (*)    APPearance CLEAR (*)    Glucose, UA >=500 (*)    All other components within normal limits  POC URINE PREG, ED  POCT PREGNANCY, URINE   ____________________________________________  EKG  None - EKG not ordered by ED physician ____________________________________________  RADIOLOGY   ED MD interpretation: No indication for imaging  Official radiology report(s): No results found.  ____________________________________________   PROCEDURES  Critical Care performed:  No   Procedure(s) performed:   Procedures   ____________________________________________   INITIAL IMPRESSION / ASSESSMENT AND PLAN / ED COURSE  As part of my medical decision making, I reviewed the following data within the electronic MEDICAL RECORD NUMBER Nursing notes reviewed and incorporated, Labs reviewed  and Old chart reviewed    Differential diagnosis includes, but is not limited to, nonspecific fatigue, anemia, sun exposure, metabolic abnormality, acute infection, depression, substance abuse.  The patient has no acute abnormalities with normal and appropriate vital signs.  Her metabolic panel and CBC are normal with no evidence of anemia.  Her urinalysis is normal other than some glucosuria and she is not pregnant.  She is eating multiple snacks in the waiting room and in the exam room and commented about how hungry she is.  She is not having any nausea, vomiting, or diarrhea.  I explained that there is not a particular test I have beyond what we have already performed to determine a cause of her fatigue.  I encouraged that she reestablish care with a primary care provider for general health maintenance but that there is not a specific intervention at this time that will help.  I encouraged her to drink plenty of fluids and get plenty of sleep but otherwise there is no other recommendation at this time and definitely no indication of any acute or emergent medical condition.  She states that she understands and will try to establish primary care provider.  I gave my usual customary return precautions.      ____________________________________________  FINAL CLINICAL IMPRESSION(S) / ED DIAGNOSES  Final diagnoses:  Fatigue, unspecified type     MEDICATIONS GIVEN DURING THIS VISIT:  Medications - No data to display   ED Discharge Orders    None       Note:  This document was prepared using Dragon voice recognition software and may include unintentional dictation  errors.    Loleta RoseForbach, Sanaya Gwilliam, MD 02/10/18 [phone]

## 2018-02-10 NOTE — Discharge Instructions (Signed)
Your workup in the Emergency Department today was reassuring.  We did not find any specific abnormalities.  We recommend you drink plenty of fluids, take your regular medications and/or any new ones prescribed today, and follow up with the doctor(s) listed in these documents as recommended.  Return to the Emergency Department if you develop new or worsening symptoms that concern you.  

## 2018-02-10 NOTE — ED Notes (Signed)
epad would not work. Pt verbalized understanding discharge paperwork and follow up.

## 2018-02-10 NOTE — ED Triage Notes (Signed)
Pt states she has bodyaches and headache for 2 days.  No n/v/d.  No chest pain or sob.  Pt alert.  Speech clear.

## 2018-04-05 ENCOUNTER — Emergency Department
Admission: EM | Admit: 2018-04-05 | Discharge: 2018-04-06 | Disposition: A | Discharge status: Home / Self Care | Payer: Self-pay | Attending: Emergency Medicine | Admitting: Emergency Medicine

## 2018-04-05 ENCOUNTER — Other Ambulatory Visit: Payer: Self-pay

## 2018-04-05 ENCOUNTER — Emergency Department: Payer: Self-pay

## 2018-04-05 DIAGNOSIS — R51 Headache: Secondary | ICD-10-CM | POA: Insufficient documentation

## 2018-04-05 DIAGNOSIS — R739 Hyperglycemia, unspecified: Secondary | ICD-10-CM | POA: Insufficient documentation

## 2018-04-05 DIAGNOSIS — R519 Headache, unspecified: Secondary | ICD-10-CM

## 2018-04-05 DIAGNOSIS — Z79899 Other long term (current) drug therapy: Secondary | ICD-10-CM | POA: Insufficient documentation

## 2018-04-05 DIAGNOSIS — F17210 Nicotine dependence, cigarettes, uncomplicated: Secondary | ICD-10-CM | POA: Insufficient documentation

## 2018-04-05 DIAGNOSIS — F14920 Cocaine use, unspecified with intoxication, uncomplicated: Secondary | ICD-10-CM | POA: Insufficient documentation

## 2018-04-05 LAB — PROTIME-INR
INR: 0.95
Prothrombin Time: 12.6 s (ref 11.4–15.2)

## 2018-04-05 LAB — CBC
HCT: 36.2 % (ref 35.0–47.0)
Hemoglobin: 12 g/dL (ref 12.0–16.0)
MCH: 27.9 pg (ref 26.0–34.0)
MCHC: 33.3 g/dL (ref 32.0–36.0)
MCV: 83.7 fL (ref 80.0–100.0)
Platelets: 156 K/uL (ref 150–440)
RBC: 4.32 MIL/uL (ref 3.80–5.20)
RDW: 17.4 % — ABNORMAL HIGH (ref 11.5–14.5)
WBC: 8.4 K/uL (ref 3.6–11.0)

## 2018-04-05 LAB — GLUCOSE, CAPILLARY
Glucose-Capillary: 186 mg/dL — ABNORMAL HIGH (ref 70–99)
Glucose-Capillary: 240 mg/dL — ABNORMAL HIGH (ref 70–99)

## 2018-04-05 LAB — COMPREHENSIVE METABOLIC PANEL WITH GFR
ALT: 151 U/L — ABNORMAL HIGH (ref 0–44)
AST: 122 U/L — ABNORMAL HIGH (ref 15–41)
Albumin: 3.6 g/dL (ref 3.5–5.0)
Alkaline Phosphatase: 57 U/L (ref 38–126)
Anion gap: 6 (ref 5–15)
BUN: 9 mg/dL (ref 6–20)
CO2: 26 mmol/L (ref 22–32)
Calcium: 8.3 mg/dL — ABNORMAL LOW (ref 8.9–10.3)
Chloride: 104 mmol/L (ref 98–111)
Creatinine, Ser: 0.91 mg/dL (ref 0.44–1.00)
GFR calc Af Amer: >60 mL/min
GFR calc non Af Amer: >60 mL/min
Glucose, Bld: 241 mg/dL — ABNORMAL HIGH (ref 70–99)
Potassium: 4 mmol/L (ref 3.5–5.1)
Sodium: 136 mmol/L (ref 135–145)
Total Bilirubin: 0.6 mg/dL (ref 0.3–1.2)
Total Protein: 6.2 g/dL — ABNORMAL LOW (ref 6.5–8.1)

## 2018-04-05 LAB — DIFFERENTIAL
Basophils Absolute: 0.1 K/uL (ref 0–0.1)
Basophils Relative: 1 %
Eosinophils Absolute: 0.1 K/uL (ref 0–0.7)
Eosinophils Relative: 2 %
Lymphocytes Relative: 19 %
Lymphs Abs: 1.6 K/uL (ref 1.0–3.6)
Monocytes Absolute: 0.4 K/uL (ref 0.2–0.9)
Monocytes Relative: 5 %
Neutro Abs: 6.2 K/uL (ref 1.4–6.5)
Neutrophils Relative %: 73 %

## 2018-04-05 LAB — APTT: aPTT: 30 s (ref 24–36)

## 2018-04-05 LAB — TROPONIN I: Troponin I: <0.03 ng/mL (ref <0.03)

## 2018-04-05 MED ORDER — SODIUM CHLORIDE 0.9 % IV BOLUS
1000.0000 mL | Freq: Once | INTRAVENOUS | Status: AC
  Administered 2018-04-05: 1000 mL via INTRAVENOUS

## 2018-04-05 NOTE — ED Notes (Signed)
Pt unhooked from monitor to get up to bathroom; no assistance necessary, pt ambulatory with steady gait

## 2018-04-05 NOTE — ED Notes (Signed)
Dr. Brown at bedside

## 2018-04-05 NOTE — ED Provider Notes (Signed)
[facility] Emergency Department Provider Note _________________________   None    (approximate)  I have reviewed the triage vital signs and the nursing notes.   HISTORY  Chief Complaint Blurred Vision and Hyperglycemia    HPI Yvonne Torres is a 47 y.o. female presents to the emergency department with acute onset of blurred vision and slurred speech which began at 12 PM today.  Patient states that she was driving back from [place] and had to stop as result of those symptoms.  Patient does admit to a headache located on the top of the head.  Patient states that she was diagnosed with prediabetes and initially started on metformin however treatment was discontinued secondary to elevated liver enzymes.  She states no further medications as prescribed and she has been diet controlled.  Patient states that today while at a restaurant her glucose was checked and was 357.   Past Medical History:  Diagnosis Date  . Opioid dependence in remission [facility]) 2019   Reports she went through residential treatment program and is clean as of June 2019    There are no active problems to display for this patient.   Past Surgical History:  Procedure Laterality Date  . ECTOPIC PREGNANCY SURGERY    . TONSILLECTOMY      Prior to Admission medications   Medication Sig Start Date End Date Taking? Authorizing Provider  diphenhydrAMINE (BENADRYL) 25 MG tablet Take 1 tablet (25 mg total) by mouth every 6 (six) hours as needed. 09/23/15   Sharman Cheek, MD  EPINEPHrine 0.3 mg/0.3 mL IJ SOAJ injection Inject 0.3 mLs (0.3 mg total) into the muscle once. Follow package instructions as needed for severe allergy or anaphylactic reaction. 09/23/15   Sharman Cheek, MD  LamoTRIgine 50 MG TBDP Take 50 mg by mouth 3 (three) times daily.    [provider]  LORazepam (ATIVAN) 1 MG tablet Take 1 tablet (1 mg total) by mouth every 8 (eight) hours as needed for anxiety.  06/13/17 06/13/18  Emily Filbert, MD    Allergies Penicillins  Family History  Problem Relation Age of Onset  . Breast cancer Mother 42    Social History Social History   Tobacco Use  . Smoking status: Current Some Day Smoker    Packs/day: 0.25    Types: Cigarettes  . Smokeless tobacco: Never Used  Substance Use Topics  . Alcohol use: Yes  . Drug use: Not on file    Review of Systems Constitutional: No fever/chills Eyes: No visual changes. ENT: No sore throat. Cardiovascular: Denies chest pain. Respiratory: Denies shortness of breath. Gastrointestinal: No abdominal pain.  No nausea, no vomiting.  No diarrhea.  No constipation. Genitourinary: Negative for dysuria. Musculoskeletal: Negative for neck pain.  Negative for back pain. Integumentary: Negative for rash. Neurological: Negative for headaches, focal weakness or numbness.  Positive for generalized weakness, slurred speech  ____________________________________________   PHYSICAL EXAM:  VITAL SIGNS: ED Triage Vitals [04/05/18 1756]  Enc Vitals Group     BP 125/75     Pulse Rate (!) 103     Resp 18     Temp 99.2 F (37.3 C)     Temp src      SpO2 95 %     Weight 100.7 kg (222 lb)     Height 1.626 m (5\' 4" )     Head Circumference      Peak Flow      Pain Score 0  Pain Loc      Pain Edu?      Excl. in GC?     Constitutional: Alert and oriented. Well appearing and in no acute distress.  Somnolent with odd affect Eyes: Conjunctivae are normal.  Head: Atraumatic. Ears:  Healthy appearing ear canals and TMs bilaterally Nose: No congestion/rhinnorhea. Mouth/Throat: Mucous membranes are moist. Oropharynx non-erythematous. Neck: No stridor.  No meningeal signs.  Cardiovascular: Normal rate, regular rhythm. Good peripheral circulation. Grossly normal heart sounds. Respiratory: Normal respiratory effort.  No retractions. Lungs CTAB. Gastrointestinal: Soft and nontender. No distention.    Musculoskeletal: No lower extremity tenderness nor edema. No gross deformities of extremities. Neurologic:  Normal speech and language. No gross focal neurologic deficits are appreciated.  Skin:  Skin is warm, dry and intact. No rash noted. Psychiatric: Bizarre affect speech and behavior are normal.  ____________________________________________   LABS (all labs ordered are listed, but only abnormal results are displayed)  Labs Reviewed  CBC - Abnormal; Notable for the following components:      Result Value   RDW 17.4 (*)    All other components within normal limits  COMPREHENSIVE METABOLIC PANEL - Abnormal; Notable for the following components:   Glucose, Bld 241 (*)    Calcium 8.3 (*)    Total Protein 6.2 (*)    AST 122 (*)    ALT 151 (*)    All other components within normal limits  GLUCOSE, CAPILLARY - Abnormal; Notable for the following components:   Glucose-Capillary 240 (*)    All other components within normal limits  PROTIME-INR  APTT  DIFFERENTIAL  TROPONIN I  CBG MONITORING, ED  POC URINE PREG, ED   ____________________________________________  EKG  ED ECG REPORT I, [place] N Joren Rehm, the attending physician, personally viewed and interpreted this ECG.   Date: 04/06/2018  EKG Time: 6:03 PM  Rate: 105  Rhythm: Sinus tachycardia   Axis: Normal  Intervals: Normal  ST&T Change: None  ____________________________________________  RADIOLOGY I,  N Ellieanna Funderburg, personally viewed and evaluated these images (plain radiographs) as part of my medical decision making, as well as reviewing the written report by the radiologist.  ED MD interpretation: CT scan and MRI of the head revealed no acute intracranial findings.  Official radiology report(s): Ct Head Wo Contrast  Result Date: 04/05/2018 CLINICAL DATA:  Blurred vision, slurred speech EXAM: CT HEAD WITHOUT CONTRAST TECHNIQUE: Contiguous axial images were obtained from the base of the skull through the  vertex without intravenous contrast. COMPARISON:  None. FINDINGS: Brain: No acute intracranial abnormality. Specifically, no hemorrhage, hydrocephalus, mass lesion, acute infarction, or significant intracranial injury. Vascular: No hyperdense vessel or unexpected calcification. Skull: No acute calvarial abnormality. Sinuses/Orbits: Visualized paranasal sinuses and mastoids clear. Orbital soft tissues unremarkable. Other: None IMPRESSION: Normal study. Electronically Signed   By: Charlett NoseKevin  Dover M.D.   On: 04/05/2018 19:02     Procedures   ____________________________________________   INITIAL IMPRESSION / ASSESSMENT AND PLAN / ED COURSE  As part of my medical decision making, I reviewed the following data within the electronic MEDICAL RECORD NUMBER   47 year old female presenting with above-stated history and physical exam secondary to headache dizziness and slurred speech.  Concern for possible CVA and as such CT scan of the head was performed which was unremarkable.  MRI of the brain was also performed which was negative.  Given patient's tachycardia, slightly elevated temperature and odd affect concern for possible illicit drug use as such urinalysis was obtained which revealed  positive cocaine.  Patient states that she used cocaine 4 days ago.  Patient alert and oriented with no slurred speech at this time or dizziness.  Patient has no complaints at present. ____________________________________________  FINAL CLINICAL IMPRESSION(S) / ED DIAGNOSES  Final diagnoses:  Hyperglycemia  Acute nonintractable headache, unspecified headache type  Cocaine intoxication, uncomplicated (HCC)     MEDICATIONS GIVEN DURING THIS VISIT:  Medications  sodium chloride 0.9 % bolus 1,000 mL (1,000 mLs Intravenous New Bag/Given 04/05/18 2335)     ED Discharge Orders    None       Note:  This document was prepared using Dragon voice recognition software and may include unintentional dictation errors.      Darci Current, MD 04/06/18 [phone]

## 2018-04-05 NOTE — ED Notes (Signed)
Pt back in bed, hooked back up to cardiac monitor;

## 2018-04-05 NOTE — ED Notes (Signed)
Pt  Had episode of blurred vision headache and weakness and tired   Seen several weeks ago and placed on metformin  Subsequent liver enzymes were elevated ate cherries and grapes  [place]  Sausages and a  Sprite had slurry of words earlier but is better now

## 2018-04-05 NOTE — ED Triage Notes (Signed)
Pt arrives to ED stating she was driving back from [place]. Stopped driving d/t blurred vision and slurry speech. Started about 12pm. Speech is clear at this time. Pt states speech sounds better. Alert, oriented. States got a HA and nausea after symptoms began. Thought it was because she didn't eat enough. CBG 357. Had EMS come to house and checked VS and CBG and told her to come to ED. States blurred vision remains, "like I can't focus." told pt a year ago she was pre-diabetic. Equal grip strength, no arm/leg drift. No facial droop noted but R eyebrow doesn't raise as much as L. Pt admits increase in thirst today.

## 2018-04-06 ENCOUNTER — Emergency Department: Payer: Self-pay

## 2018-04-06 LAB — URINALYSIS, COMPLETE (UACMP) WITH MICROSCOPIC
BACTERIA UA: NONE SEEN
Bilirubin Urine: NEGATIVE
Glucose, UA: 150 mg/dL — AB
Ketones, ur: NEGATIVE mg/dL
Leukocytes, UA: NEGATIVE
NITRITE: NEGATIVE
Protein, ur: NEGATIVE mg/dL
SPECIFIC GRAVITY, URINE: 1.021 (ref 1.005–1.030)
pH: 5 (ref 5.0–8.0)

## 2018-04-06 LAB — POCT PREGNANCY, URINE: PREG TEST UR: NEGATIVE

## 2018-04-06 LAB — URINE DRUG SCREEN, QUALITATIVE (ARMC ONLY)
Amphetamines, Ur Screen: NOT DETECTED
BARBITURATES, UR SCREEN: NOT DETECTED
Benzodiazepine, Ur Scrn: NOT DETECTED
CANNABINOID 50 NG, UR ~~LOC~~: NOT DETECTED
COCAINE METABOLITE, UR ~~LOC~~: POSITIVE — AB
MDMA (Ecstasy)Ur Screen: NOT DETECTED
Methadone Scn, Ur: NOT DETECTED
Opiate, Ur Screen: NOT DETECTED
PHENCYCLIDINE (PCP) UR S: NOT DETECTED
TRICYCLIC, UR SCREEN: NOT DETECTED

## 2018-04-06 NOTE — ED Notes (Addendum)
In with Dr Manson PasseyBrown to speak with pt alone regarding her positive cocaine result; pt requests nothing be said about this in front of her husband who is to arrive shortly;  thermostat adjusted colder at pt request;

## 2018-04-06 NOTE — ED Notes (Signed)
RN to bedside with phone so MRI tech can screen patient for scan.

## 2018-04-06 NOTE — ED Notes (Signed)
Pt to MRI via stretcher with ED tech Mayra

## 2018-04-06 NOTE — ED Notes (Signed)
In to let pt know MD was working on discharge papers, just about 5 minutes after husband came out to see where the papers where; nobody is in the room; gown is laying on the bed;

## 2018-04-06 NOTE — ED Notes (Signed)
Dr Manson PasseyBrown in to follow up with pt and husband; per pt's earlier request, her cocaine use was not mentioned in front of her husband (who has recently arrived to the ED)

## 2018-04-21 ENCOUNTER — Emergency Department
Admission: EM | Admit: 2018-04-21 | Discharge: 2018-04-22 | Disposition: A | Discharge status: Other Acute Inpatient Hospital | Payer: Self-pay | Attending: Student in an Organized Health Care Education/Training Program | Admitting: Student in an Organized Health Care Education/Training Program

## 2018-04-21 ENCOUNTER — Other Ambulatory Visit: Payer: Self-pay

## 2018-04-21 ENCOUNTER — Encounter: Payer: Self-pay | Admitting: Emergency Medicine

## 2018-04-21 DIAGNOSIS — F1020 Alcohol dependence, uncomplicated: Secondary | ICD-10-CM | POA: Diagnosis present

## 2018-04-21 DIAGNOSIS — F17210 Nicotine dependence, cigarettes, uncomplicated: Secondary | ICD-10-CM | POA: Insufficient documentation

## 2018-04-21 DIAGNOSIS — E119 Type 2 diabetes mellitus without complications: Secondary | ICD-10-CM | POA: Insufficient documentation

## 2018-04-21 DIAGNOSIS — R440 Auditory hallucinations: Secondary | ICD-10-CM | POA: Insufficient documentation

## 2018-04-21 DIAGNOSIS — F429 Obsessive-compulsive disorder, unspecified: Secondary | ICD-10-CM

## 2018-04-21 DIAGNOSIS — F1420 Cocaine dependence, uncomplicated: Secondary | ICD-10-CM | POA: Diagnosis present

## 2018-04-21 DIAGNOSIS — F4310 Post-traumatic stress disorder, unspecified: Secondary | ICD-10-CM

## 2018-04-21 DIAGNOSIS — F1121 Opioid dependence, in remission: Secondary | ICD-10-CM | POA: Insufficient documentation

## 2018-04-21 DIAGNOSIS — F319 Bipolar disorder, unspecified: Secondary | ICD-10-CM | POA: Insufficient documentation

## 2018-04-21 DIAGNOSIS — F312 Bipolar disorder, current episode manic severe with psychotic features: Secondary | ICD-10-CM | POA: Diagnosis present

## 2018-04-21 DIAGNOSIS — Z79899 Other long term (current) drug therapy: Secondary | ICD-10-CM | POA: Insufficient documentation

## 2018-04-21 DIAGNOSIS — R443 Hallucinations, unspecified: Secondary | ICD-10-CM

## 2018-04-21 DIAGNOSIS — F17200 Nicotine dependence, unspecified, uncomplicated: Secondary | ICD-10-CM | POA: Diagnosis present

## 2018-04-21 HISTORY — DX: Bipolar disorder, unspecified: F31.9

## 2018-04-21 HISTORY — DX: Type 2 diabetes mellitus without complications: E11.9

## 2018-04-21 LAB — CBC
HCT: 41.2 % (ref 35.0–47.0)
HEMOGLOBIN: 13.7 g/dL (ref 12.0–16.0)
MCH: 27.5 pg (ref 26.0–34.0)
MCHC: 33.2 g/dL (ref 32.0–36.0)
MCV: 83 fL (ref 80.0–100.0)
Platelets: 222 10*3/uL (ref 150–440)
RBC: 4.96 MIL/uL (ref 3.80–5.20)
RDW: 16.7 % — ABNORMAL HIGH (ref 11.5–14.5)
WBC: 9 10*3/uL (ref 3.6–11.0)

## 2018-04-21 LAB — COMPREHENSIVE METABOLIC PANEL
ALBUMIN: 3.5 g/dL (ref 3.5–5.0)
ALK PHOS: 56 U/L (ref 38–126)
ALT: 86 U/L — AB (ref 0–44)
AST: 57 U/L — AB (ref 15–41)
Anion gap: 9 (ref 5–15)
BUN: 13 mg/dL (ref 6–20)
CALCIUM: 8.7 mg/dL — AB (ref 8.9–10.3)
CO2: 22 mmol/L (ref 22–32)
Chloride: 105 mmol/L (ref 98–111)
Creatinine, Ser: 0.73 mg/dL (ref 0.44–1.00)
GFR calc Af Amer: >60 mL/min (ref >60)
GFR calc non Af Amer: >60 mL/min (ref >60)
Glucose, Bld: 221 mg/dL — ABNORMAL HIGH (ref 70–99)
Potassium: 3.7 mmol/L (ref 3.5–5.1)
Sodium: 136 mmol/L (ref 135–145)
TOTAL PROTEIN: 6 g/dL — AB (ref 6.5–8.1)
Total Bilirubin: 0.7 mg/dL (ref 0.3–1.2)

## 2018-04-21 LAB — URINE DRUG SCREEN, QUALITATIVE (ARMC ONLY)
Amphetamines, Ur Screen: NOT DETECTED
BARBITURATES, UR SCREEN: NOT DETECTED
COCAINE METABOLITE, UR ~~LOC~~: POSITIVE — AB
Cannabinoid 50 Ng, Ur ~~LOC~~: NOT DETECTED
MDMA (Ecstasy)Ur Screen: NOT DETECTED
Methadone Scn, Ur: NOT DETECTED
OPIATE, UR SCREEN: NOT DETECTED
PHENCYCLIDINE (PCP) UR S: NOT DETECTED
Tricyclic, Ur Screen: NOT DETECTED

## 2018-04-21 LAB — SALICYLATE LEVEL: Salicylate Lvl: <7 mg/dL (ref 2.8–30.0)

## 2018-04-21 LAB — POCT PREGNANCY, URINE: Preg Test, Ur: NEGATIVE

## 2018-04-21 LAB — ETHANOL: Alcohol, Ethyl (B): 13 mg/dL — ABNORMAL HIGH (ref <10)

## 2018-04-21 LAB — ACETAMINOPHEN LEVEL: Acetaminophen (Tylenol), Serum: <10 ug/mL — ABNORMAL LOW (ref 10–30)

## 2018-04-21 MED ORDER — LORAZEPAM 1 MG PO TABS
1.0000 mg | ORAL_TABLET | Freq: Once | ORAL | Status: AC
  Administered 2018-04-21: 1 mg via ORAL
  Filled 2018-04-21: qty 1

## 2018-04-21 NOTE — ED Notes (Signed)
Pt states she has been hearing voices since she was a child and her grandmother told her to never tell anyone as long as they were not telling her to do bad things, states they are just whispers, denies any threatening content. States she has days where she will not sleep and then days where she sleeps all day. States "I cant take it anymore and just want to be normal",. Pt is tearful/crying. States she has periods when she doesn't remember things and stopped keeping her grandchildren in fear of forgetting them or neglecting them. States my house is a mess there is dog shit everywhere because I have been sleeping for days. States she was on taking respiradol, lamictal and another medication that was helping but was not able to afford to get it refilled.

## 2018-04-21 NOTE — ED Notes (Signed)
TTS in to see pt  

## 2018-04-21 NOTE — ED Notes (Signed)
Hourly rounding reveals patient in room sleeping. No complaints, stable, in no acute distress. Q15 minute rounds and monitoring via [employer] Cam[employer] to continue.  

## 2018-04-21 NOTE — ED Provider Notes (Signed)
[facility] Emergency Department Provider Note    First MD Initiated Contact with Patient 04/21/18 1429     (approximate)  I have reviewed the triage vital signs and the nursing notes.   HISTORY  Chief Complaint Psychiatric Evaluation    HPI Earna CoderHeather Shindler is a 47 y.o. female long history of opioid dependence presents to the ER for evaluation of hearing voices.  States that she is been hearing voices for a very long time since she was a little girl was told by her grandmother not to tell anyone.  States that she does have the voices telling her to do anything but states that it is getting more severe and frequent.  States that she "can no longer live like this ".  Patient very tearful.  States that her husband thinks that she "she is nuts."    Past Medical History:  Diagnosis Date  . Bipolar 1 disorder (HCC)   . Diabetes mellitus without complication (HCC)   . Opioid dependence in remission [facility](HCC) 2019   Reports she went through residential treatment program and is clean as of June 2019   Family History  Problem Relation Age of Onset  . Breast cancer Mother [id number]   Past Surgical History:  Procedure Laterality Date  . ECTOPIC PREGNANCY SURGERY    . TONSILLECTOMY     There are no active problems to display for this patient.     Prior to Admission medications   Medication Sig Start Date End Date Taking? Authorizing Provider  diphenhydrAMINE (BENADRYL) 25 MG tablet Take 1 tablet (25 mg total) by mouth every 6 (six) hours as needed. 09/23/15   Sharman CheekStafford, Phillip, MD  EPINEPHrine 0.3 mg/0.3 mL IJ SOAJ injection Inject 0.3 mLs (0.3 mg total) into the muscle once. Follow package instructions as needed for severe allergy or anaphylactic reaction. 09/23/15   Sharman CheekStafford, Phillip, MD  LamoTRIgine 50 MG TBDP Take 50 mg by mouth 3 (three) times daily.    [provider]  LORazepam (ATIVAN) 1 MG tablet Take 1 tablet (1 mg total) by mouth every 8 (eight)  hours as needed for anxiety. 06/13/17 06/13/18  Emily FilbertWilliams, Jonathan E, MD    Allergies Penicillins    Social History Social History   Tobacco Use  . Smoking status: Current Some Day Smoker    Packs/day: 0.25    Types: Cigarettes  . Smokeless tobacco: Never Used  Substance Use Topics  . Alcohol use: Yes  . Drug use: Not on file    Review of Systems Patient denies headaches, rhinorrhea, blurry vision, numbness, shortness of breath, chest pain, edema, cough, abdominal pain, nausea, vomiting, diarrhea, dysuria, fevers, rashes or hallucinations unless otherwise stated above in HPI. ____________________________________________   PHYSICAL EXAM:  VITAL SIGNS: Vitals:   04/21/18 1357  BP: (!) 148/77  Pulse: 92  Resp: 16  Temp: 98.4 F (36.9 C)  SpO2: 96%    Constitutional: Alert and oriented. Tearful, very anxious appearing Eyes: Conjunctivae are normal.  Head: Atraumatic. Nose: No congestion/rhinnorhea. Mouth/Throat: Mucous membranes are moist.   Neck: No stridor. Painless ROM.  Cardiovascular: Normal rate, regular rhythm. Grossly normal heart sounds.  Good peripheral circulation. Respiratory: Normal respiratory effort.  No retractions. Lungs CTAB. Gastrointestinal: Soft and nontender. No distention. No abdominal bruits. No CVA tenderness. Genitourinary:  Musculoskeletal: No lower extremity tenderness nor edema.  No joint effusions. Neurologic:  Normal speech and language. No gross focal neurologic deficits are appreciated. No facial droop Skin:  Skin is warm,  dry and intact. No rash noted. Psychiatric: tearful anxious, difficult to redirect____________________________________________   LABS (all labs ordered are listed, but only abnormal results are displayed)  No results found for this or any previous visit (from the past 24  hour(s)). __________________________________________________________________________________  RADIOLOGY   ____________________________________________   PROCEDURES  Procedure(s) performed:  Procedures    Critical Care performed: no ____________________________________________   INITIAL IMPRESSION / ASSESSMENT AND PLAN / ED COURSE  Pertinent labs & imaging results that were available during my care of the patient were reviewed by me and considered in my medical decision making (see chart for details).   DDX: Psychosis, delirium, medication effect, noncompliance, polysubstance abuse, Si, Hi, depression   Herbert SetaHeather Pinkhasov is a 47 y.o. who presents to the ED with for evaluation of hallucinations.  Patient has psych history of bipolar disorder without recent med changes.  Also + for substance abuse.  Laboratory testing was ordered to evaluation for underlying electrolyte derangement or signs of underlying organic pathology to explain today's presentation.  Based on history and physical and laboratory evaluation, it appears that the patient's presentation is 2/2 underlying psychiatric disorder and will require further evaluation and management by inpatient psychiatry.  Patient was made an IVC due to hallucinations, passive suicidal ideation and disorganized behavior.  Disposition pending psychiatric evaluation.       As part of my medical decision making, I reviewed the following data within the electronic MEDICAL RECORD NUMBER Nursing notes reviewed and incorporated, Labs reviewed, notes from prior ED visits.  ____________________________________________   FINAL CLINICAL IMPRESSION(S) / ED DIAGNOSES  Final diagnoses:  Hallucinations      NEW MEDICATIONS STARTED DURING THIS VISIT:  New Prescriptions   No medications on file     Note:  This document was prepared using Dragon voice recognition software and may include unintentional dictation errors.    Willy Eddyobinson, Delrae Hagey,  MD 04/21/18 [phone]

## 2018-04-21 NOTE — ED Notes (Signed)
TTS Erica informed of pt recommended for inpatient treatment.

## 2018-04-21 NOTE — BH Assessment (Addendum)
Assessment Note  Yvonne Torres is an 47 y.o. female who was admitted to hospital ED vol. as she states she hears voices, and the consistency has increased.  Pt. was tearful during assessment and states she "just wants help". Pt. reports that she has heard voices since a child and describes it as "white noise, sometimes hears voices calling her name, but never command.  Pt. also reports that she self-medicates on alcohol ( up to 12 pk of beer daily), cocaine and opiates (use as much as can get).  Pt. reports to currently being on probation for trafficking marijuana.  Pt. also reports she is married, however separated, as her husband does not under her mental illness. Pt. reports that her sleeping habits vary as she sometimes sleeps up to 20 hours per day and sometimes gets no sleep.  Pt. also reports her eating habits fluctuate as well, most recently losing approx. 14 pounds. Pt. denies SI/HI.     Diagnosis: Bipolar I D/O Past Medical History:  Past Medical History:  Diagnosis Date  . Bipolar 1 disorder (HCC)   . Diabetes mellitus without complication (HCC)   . Opioid dependence in remission [facility](HCC) 2019   Reports she went through residential treatment program and is clean as of June 2019    Past Surgical History:  Procedure Laterality Date  . ECTOPIC PREGNANCY SURGERY    . TONSILLECTOMY      Family History:  Family History  Problem Relation Age of Onset  . Breast cancer Mother [id number]    Social History:  reports that she has been smoking cigarettes. She has been smoking about 0.25 packs per day. She has never used smokeless tobacco. She reports that she drinks alcohol. Her drug history is not on file.  Additional Social History:  Alcohol / Drug Use Pain Medications: See MAR Prescriptions: See MAR Over the Counter: See MAR  CIWA: CIWA-Ar BP: (!) 118/40 Pulse Rate: 80 COWS:    Allergies:  Allergies  Allergen Reactions  . Penicillins     Home Medications:  (Not in a hospital  admission)  OB/GYN Status:  Patient's last menstrual period was 04/05/2018.  General Assessment Data [facility] Assessment: In system Is this a Tele or Face-to-Face Assessment?: Face-to-Face Is this an Initial Assessment or a Re-assessment for this encounter?: Initial Assessment Marital status: Married [place] Jaf[place] Is patient pregnant?: No Pregnancy Status: No Living Arrangements: Alone Can pt return to current living arrangement?: Yes Admission Status: Voluntary Is patient capable of signing voluntary admission?: Yes Referral Source: Self/Family/Friend Insurance type: None  Medical Screening Exam [facility] Walk-in ONLY) Medical Exam completed: Yes  Crisis Care Plan Living Arrangements: Alone Name of Psychiatrist: None Name of Therapist: None  Education Status Is patient currently in school?: No Is the patient employed, unemployed or receiving disability?: Unemployed  Risk to self with the past 6 months Suicidal Ideation: No Has patient been a risk to self within the past 6 months prior to admission? : No Suicidal Intent: No Has patient had any suicidal intent within the past 6 months prior to admission? : No Is patient at risk for suicide?: No Suicidal Plan?: No Has patient had any suicidal plan within the past 6 months prior to admission? : No Access to Means: No What has been your use of drugs/alcohol within the last 12 months?: Heavy Use(Uses cocaine, alcohol, and opiates) Previous Attempts/Gestures: No How many times?: 0 Other Self Harm Risks: none Triggers for Past Attempts: Unknown Intentional  Self Injurious Behavior: None Family Suicide History: No Recent stressful life event(s): Conflict (Comment)(marital conflict) Persecutory voices/beliefs?: No Depression: Yes Depression Symptoms: Feeling worthless/self pity Substance abuse history and/or treatment for substance abuse?: Yes Suicide prevention information given to  non-admitted patients: Yes  Risk to Others within the past 6 months Homicidal Ideation: No Does patient have any lifetime risk of violence toward others beyond the six months prior to admission? : No Thoughts of Harm to Others: No Current Homicidal Intent: No Current Homicidal Plan: No Access to Homicidal Means: No Identified Victim: N/A History of harm to others?: No Assessment of Violence: None Noted Violent Behavior Description: N/A Does patient have access to weapons?: No Criminal Charges Pending?: No Does patient have a court date: No Is patient on probation?: Yes  Psychosis Hallucinations: Auditory Delusions: None noted  Mental Status Report Appearance/Hygiene: In scrubs Eye Contact: Fair Motor Activity: Unremarkable Speech: Soft Level of Consciousness: Alert Mood: Depressed Affect: Flat Anxiety Level: Moderate Thought Processes: Coherent Judgement: Unimpaired Orientation: Appropriate for developmental age Obsessive Compulsive Thoughts/Behaviors: None  Cognitive Functioning Concentration: Good Memory: Recent Intact Is patient IDD: No Is patient DD?: No Insight: Fair Impulse Control: Fair Appetite: Poor Have you had any weight changes? : Loss Amount of the weight change? (lbs): 14 lbs Sleep: No Change Total Hours of Sleep: 10 Vegetative Symptoms: None  ADLScreening [facility] Assessment Services) Patient's cognitive ability adequate to safely complete daily activities?: Yes Patient able to express need for assistance with ADLs?: Yes Independently performs ADLs?: Yes (appropriate for developmental age)  Prior Inpatient Therapy Prior Inpatient Therapy: No  Prior Outpatient Therapy Prior Outpatient Therapy: No Does patient have an ACCT team?: No Does patient have Intensive In-House Services?  : No Does patient have [facility] services? : No Does patient have P4CC services?: No  ADL Screening (condition at time of admission) Patient's cognitive ability  adequate to safely complete daily activities?: Yes Is the patient deaf or have difficulty hearing?: No Does the patient have difficulty seeing, even when wearing glasses/contacts?: No Does the patient have difficulty concentrating, remembering, or making decisions?: No Patient able to express need for assistance with ADLs?: Yes Does the patient have difficulty dressing or bathing?: No Independently performs ADLs?: Yes (appropriate for developmental age) Does the patient have difficulty walking or climbing stairs?: No Weakness of Legs: None Weakness of Arms/Hands: None  Home Assistive Devices/Equipment Home Assistive Devices/Equipment: None  Therapy Consults (therapy consults require a physician order) PT Evaluation Needed: No OT Evalulation Needed: No SLP Evaluation Needed: No Abuse/Neglect Assessment (Assessment to be complete while patient is alone) Abuse/Neglect Assessment Can Be Completed: Yes Physical Abuse: Denies Verbal Abuse: Denies Sexual Abuse: Denies Exploitation of patient/patient's resources: Denies Self-Neglect: Denies Values / Beliefs Cultural Requests During Hospitalization: None Spiritual Requests During Hospitalization: None Consults Spiritual Care Consult Needed: No Social Work Consult Needed: No Merchant navy officerAdvance Directives (For Healthcare) Does Patient Have a Medical Advance Directive?: No    Additional Information 1:1 In Past 12 Months?: No CIRT Risk: No Elopement Risk: No Does patient have medical clearance?: Yes     Disposition:  Disposition Initial Assessment Completed for this Encounter: Yes Disposition of Patient: Admit Type of inpatient treatment program: Adult Patient refused recommended treatment: No Mode of transportation if patient is discharged?: Car Patient referred to: Other (Comment)(Pending)  On Site Evaluation by:   Reviewed with Physician:    Ellery PlunkErica  Kash Davie 04/21/2018 10:06 PM

## 2018-04-21 NOTE — ED Notes (Signed)
SOC set up at the bedside.  

## 2018-04-21 NOTE — ED Notes (Signed)
IVC, SOC recommends inpatient service

## 2018-04-21 NOTE — ED Notes (Signed)
Pt. Transferred to BHU from ED to room 5 after screening for contraband. Report to include Situation, Background, Assessment and Recommendations from RN Ann. Pt. Oriented to unit including Q15 minute rounds as well as the security cameras for their protection. Patient is alert and oriented, warm and dry in no acute distress. Patient denies SI, HI, and AVH. Pt. Encouraged to let me know if needs arise.

## 2018-04-21 NOTE — ED Triage Notes (Signed)
Says she has been hearing voices since she was a little girl.  Says for the past month she cant get  High enough for it to go away.  Patient tearful .  Says her husband thinks she is nuts.  Says she just hears whispers.

## 2018-04-21 NOTE — ED Notes (Signed)
Pt dressed out into burgandy scrubs with this NT, pt belongings include: White T-shirt Pink Bra Blue Pants Blue panties Denim Purse [employer]Cell Phone Flip flops

## 2018-04-21 NOTE — ED Notes (Signed)
Pt speaking with husband on the phone

## 2018-04-21 NOTE — ED Notes (Signed)
Called dining services to request pt dinner tray. 

## 2018-04-22 ENCOUNTER — Encounter: Payer: Self-pay | Admitting: Psychiatry

## 2018-04-22 ENCOUNTER — Other Ambulatory Visit: Payer: Self-pay

## 2018-04-22 ENCOUNTER — Inpatient Hospital Stay
Admission: AD | Admit: 2018-04-22 | Discharge: 2018-04-27 | DRG: 885 | Disposition: A | Discharge status: Home / Self Care | Payer: No Typology Code available for payment source | Attending: Psychiatry | Admitting: Psychiatry

## 2018-04-22 DIAGNOSIS — Z560 Unemployment, unspecified: Secondary | ICD-10-CM | POA: Diagnosis not present

## 2018-04-22 DIAGNOSIS — Z9114 Patient's other noncompliance with medication regimen: Secondary | ICD-10-CM | POA: Diagnosis not present

## 2018-04-22 DIAGNOSIS — F429 Obsessive-compulsive disorder, unspecified: Secondary | ICD-10-CM

## 2018-04-22 DIAGNOSIS — F1024 Alcohol dependence with alcohol-induced mood disorder: Secondary | ICD-10-CM | POA: Diagnosis present

## 2018-04-22 DIAGNOSIS — F1123 Opioid dependence with withdrawal: Secondary | ICD-10-CM | POA: Diagnosis present

## 2018-04-22 DIAGNOSIS — Z8759 Personal history of other complications of pregnancy, childbirth and the puerperium: Secondary | ICD-10-CM | POA: Diagnosis not present

## 2018-04-22 DIAGNOSIS — F10239 Alcohol dependence with withdrawal, unspecified: Secondary | ICD-10-CM | POA: Diagnosis present

## 2018-04-22 DIAGNOSIS — F3164 Bipolar disorder, current episode mixed, severe, with psychotic features: Secondary | ICD-10-CM | POA: Diagnosis present

## 2018-04-22 DIAGNOSIS — F1420 Cocaine dependence, uncomplicated: Secondary | ICD-10-CM | POA: Diagnosis present

## 2018-04-22 DIAGNOSIS — F1020 Alcohol dependence, uncomplicated: Secondary | ICD-10-CM | POA: Diagnosis present

## 2018-04-22 DIAGNOSIS — F312 Bipolar disorder, current episode manic severe with psychotic features: Principal | ICD-10-CM | POA: Diagnosis present

## 2018-04-22 DIAGNOSIS — Z818 Family history of other mental and behavioral disorders: Secondary | ICD-10-CM

## 2018-04-22 DIAGNOSIS — E119 Type 2 diabetes mellitus without complications: Secondary | ICD-10-CM | POA: Diagnosis present

## 2018-04-22 DIAGNOSIS — F419 Anxiety disorder, unspecified: Secondary | ICD-10-CM | POA: Diagnosis present

## 2018-04-22 DIAGNOSIS — F4310 Post-traumatic stress disorder, unspecified: Secondary | ICD-10-CM | POA: Diagnosis present

## 2018-04-22 DIAGNOSIS — Z880 Allergy status to penicillin: Secondary | ICD-10-CM

## 2018-04-22 DIAGNOSIS — Z62810 Personal history of physical and sexual abuse in childhood: Secondary | ICD-10-CM | POA: Diagnosis present

## 2018-04-22 DIAGNOSIS — F17200 Nicotine dependence, unspecified, uncomplicated: Secondary | ICD-10-CM | POA: Diagnosis present

## 2018-04-22 DIAGNOSIS — F1424 Cocaine dependence with cocaine-induced mood disorder: Secondary | ICD-10-CM | POA: Diagnosis present

## 2018-04-22 DIAGNOSIS — Z653 Problems related to other legal circumstances: Secondary | ICD-10-CM

## 2018-04-22 DIAGNOSIS — Z803 Family history of malignant neoplasm of breast: Secondary | ICD-10-CM

## 2018-04-22 DIAGNOSIS — F17210 Nicotine dependence, cigarettes, uncomplicated: Secondary | ICD-10-CM | POA: Diagnosis present

## 2018-04-22 DIAGNOSIS — Z9089 Acquired absence of other organs: Secondary | ICD-10-CM | POA: Diagnosis not present

## 2018-04-22 DIAGNOSIS — R252 Cramp and spasm: Secondary | ICD-10-CM | POA: Diagnosis present

## 2018-04-22 DIAGNOSIS — F515 Nightmare disorder: Secondary | ICD-10-CM | POA: Diagnosis present

## 2018-04-22 DIAGNOSIS — R945 Abnormal results of liver function studies: Secondary | ICD-10-CM | POA: Diagnosis present

## 2018-04-22 DIAGNOSIS — G4701 Insomnia due to medical condition: Secondary | ICD-10-CM | POA: Diagnosis present

## 2018-04-22 DIAGNOSIS — Z915 Personal history of self-harm: Secondary | ICD-10-CM

## 2018-04-22 DIAGNOSIS — F1110 Opioid abuse, uncomplicated: Secondary | ICD-10-CM

## 2018-04-22 MED ORDER — MAGNESIUM HYDROXIDE 400 MG/5ML PO SUSP: 30.0000 mL | Freq: Every day | ORAL | Status: DC | PRN

## 2018-04-22 MED ORDER — ONDANSETRON 4 MG PO TBDP
4.0000 mg | ORAL_TABLET | Freq: Once | ORAL | Status: AC
  Administered 2018-04-22: 4 mg via ORAL
  Filled 2018-04-22: qty 1

## 2018-04-22 MED ORDER — TRAZODONE HCL 100 MG PO TABS
100.0000 mg | ORAL_TABLET | Freq: Every evening | ORAL | Status: DC | PRN
  Administered 2018-04-22 – 2018-04-25 (×4): 100 mg via ORAL
  Filled 2018-04-22 (×4): qty 1

## 2018-04-22 MED ORDER — RISPERIDONE 1 MG PO TABS
2.0000 mg | ORAL_TABLET | Freq: Two times a day (BID) | ORAL | Status: DC
  Administered 2018-04-22 – 2018-04-23 (×2): 2 mg via ORAL
  Filled 2018-04-22 (×2): qty 2

## 2018-04-22 MED ORDER — ACETAMINOPHEN 325 MG PO TABS: 650.0000 mg | ORAL_TABLET | Freq: Four times a day (QID) | ORAL | Status: DC | PRN

## 2018-04-22 MED ORDER — CHLORDIAZEPOXIDE HCL 25 MG PO CAPS
25.0000 mg | ORAL_CAPSULE | Freq: Four times a day (QID) | ORAL | Status: DC
  Administered 2018-04-22 (×2): 25 mg via ORAL
  Filled 2018-04-22 (×2): qty 1

## 2018-04-22 MED ORDER — HYDROXYZINE HCL 50 MG PO TABS: 50.0000 mg | ORAL_TABLET | Freq: Three times a day (TID) | ORAL | Status: DC | PRN

## 2018-04-22 MED ORDER — NICOTINE 21 MG/24HR TD PT24
21.0000 mg | MEDICATED_PATCH | Freq: Every day | TRANSDERMAL | Status: DC
  Administered 2018-04-22: 21 mg via TRANSDERMAL
  Filled 2018-04-22: qty 1

## 2018-04-22 MED ORDER — ALUM & MAG HYDROXIDE-SIMETH 200-200-20 MG/5ML PO SUSP: 30.0000 mL | ORAL | Status: DC | PRN

## 2018-04-22 MED ORDER — NICOTINE 21 MG/24HR TD PT24
21.0000 mg | MEDICATED_PATCH | Freq: Every day | TRANSDERMAL | Status: DC
  Administered 2018-04-23 – 2018-04-27 (×5): 21 mg via TRANSDERMAL
  Filled 2018-04-22 (×5): qty 1

## 2018-04-22 MED ORDER — LAMOTRIGINE 25 MG PO TABS
25.0000 mg | ORAL_TABLET | Freq: Every day | ORAL | Status: DC
  Administered 2018-04-22 – 2018-04-26 (×5): 25 mg via ORAL
  Filled 2018-04-22 (×5): qty 1

## 2018-04-22 MED ORDER — ACETAMINOPHEN 325 MG PO TABS
650.0000 mg | ORAL_TABLET | Freq: Once | ORAL | Status: AC
  Administered 2018-04-22: 650 mg via ORAL
  Filled 2018-04-22: qty 2

## 2018-04-22 NOTE — ED Notes (Signed)
IVC/SOC completed/Rec.Inpt Admit/Moved to BHU/ Pending placement

## 2018-04-22 NOTE — BH Assessment (Addendum)
Pt d/c to BMU as ordered. Report called to receiving nurse Dedra SkeensGwen, RN at [phone]. Pt informed of transfer procedure and verbalized understanding. Pt appears to be in no physical distress at this time. Will transport pt via wheelchair [place]'s [place]C status. Safety checks maintained without self harm gestures or outburst thus far.

## 2018-04-22 NOTE — ED Notes (Signed)
Patient c/o nausea and headache. She said. " I feel like having hot/cold flashes and my head hurts". MD on call notified at [phone]. Verbal order was taken for 4 mg Zofran and 650 mg of Tylenol.

## 2018-04-22 NOTE — ED Notes (Signed)
Hourly rounding reveals patient in room sleeping. No complaints, stable, in no acute distress. Q15 minute rounds and monitoring via [employer] Cam[employer] to continue.  

## 2018-04-22 NOTE — Progress Notes (Signed)
Admission Note:  D: Pt appeared depressed  With  a flat affect.  Pt  denies Suicidal and homicidal  But auditory hallucinations at this time. Voice of it sounding like white noise.  Pt is redirectable and cooperative with assessment.   Patient reports drinking  a 12 pack of beer daily and a fifth at least 3 times a week . Patient  currently on probation .  Voice of long periods of not sleeping  And periods of sleeping long periods . Drugs of choice opiates and alcohol. Voice of being borderline Diabetic  A: Pt admitted to unit per protocol, skin assessment and search done and no contraband found with MHT  Jasmine F.  Pt  educated on therapeutic milieu rules. Pt was introduced to milieu by nursing staff.    R: Pt was receptive to education about the milieu .  15 min safety checks started. Clinical research associatewriter offered support

## 2018-04-22 NOTE — Tx Team (Signed)
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  The patient and family have been given the opportunity to ask questions and make suggestions.  Crist InfanteGwen A Dylen Mcelhannon, RN 04/22/2018, 5:06 PM
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
## 2018-04-22 NOTE — Progress Notes (Signed)
Pt awake in bed, fidgety but has been cooperative with care. Presents anxious with fair eye contact, speech is logical on interactions. Denies SI, HI and pain. Endorsed intermittent +AVH "I hear whispers and see shadow but it's on and off". Vitals done, WNL and updated in chart. Emotional support offered. Encouraged pt to voice concerns and comply with current treatment regimen. Pt tolerated breakfast and fluids well. Pending placement. Safety checks maintained at Q 15 minutes intervals without outburst or self harm gestures.

## 2018-04-22 NOTE — BH Assessment (Signed)
[facility]. [facility] by Dr. Jennet MaduroPucilowska.  Attending Physician will be Dr. Johnella MoloneyMcNew.   Patient has been assigned to room 312, [facility] Lillette BoxerGwen F.   ER staff is aware of the admission:  Misty StanleyLisa, ER Kathrynn SpeedSectary   Dr. Darnelle CatalanMalinda, ER MD   Lincoln Maxinlivette, Patient's Nurse   Ethelene BrownsAnthony, Patient Access.

## 2018-04-22 NOTE — Plan of Care (Signed)
New Admission  Education given  in clear formate Problem: Education: Goal: Knowledge of  General Education information/materials will improve Outcome: Not Progressing Able to verbalize medication ,and understand

## 2018-04-22 NOTE — ED Provider Notes (Signed)
-----------------------------------------   9:47 AM on 04/22/2018 -----------------------------------------   Blood pressure (!) 118/40, pulse 80, temperature 98.7 F (37.1 C), temperature source Oral, resp. rate 18, height 5\' 4"  (1.626 m), weight 100.6 kg, last menstrual period 04/05/2018, SpO2 97 %.  The patient had no acute events since last update.  Calm and cooperative at this time.  Disposition is pending Psychiatry/Behavioral Medicine team recommendations.     Arnaldo NatalMalinda, Paul F, MD 04/22/18 [phone]

## 2018-04-22 NOTE — ED Notes (Signed)
Hourly rounding reveals patient in room sleeping. Unable to assess pain,Patient was sleeping stable, in no acute distress. Q15 minute rounds and monitoring via [employer] to continue.

## 2018-04-23 DIAGNOSIS — F1110 Opioid abuse, uncomplicated: Secondary | ICD-10-CM

## 2018-04-23 DIAGNOSIS — F312 Bipolar disorder, current episode manic severe with psychotic features: Principal | ICD-10-CM

## 2018-04-23 LAB — LIPID PANEL
Cholesterol: 187 mg/dL (ref 0–200)
HDL: 42 mg/dL (ref >40)
LDL CALC: 79 mg/dL (ref 0–99)
TRIGLYCERIDES: 332 mg/dL — AB (ref <150)
Total CHOL/HDL Ratio: 4.5 RATIO
VLDL: 66 mg/dL — AB (ref 0–40)

## 2018-04-23 LAB — HEMOGLOBIN A1C
Hgb A1c MFr Bld: 8.1 % — ABNORMAL HIGH (ref 4.8–5.6)
Mean Plasma Glucose: 185.77 mg/dL

## 2018-04-23 LAB — TSH: TSH: 3.984 u[IU]/mL (ref 0.350–4.500)

## 2018-04-23 MED ORDER — VITAMIN B-1 100 MG PO TABS
100.0000 mg | ORAL_TABLET | Freq: Every day | ORAL | Status: DC
  Administered 2018-04-24 – 2018-04-27 (×4): 100 mg via ORAL
  Filled 2018-04-23 (×4): qty 1

## 2018-04-23 MED ORDER — THIAMINE HCL 100 MG/ML IJ SOLN
100.0000 mg | Freq: Once | INTRAMUSCULAR | Status: AC
  Administered 2018-04-23: 100 mg via INTRAMUSCULAR
  Filled 2018-04-23: qty 1

## 2018-04-23 MED ORDER — DICYCLOMINE HCL 20 MG PO TABS
20.0000 mg | ORAL_TABLET | Freq: Four times a day (QID) | ORAL | Status: DC | PRN
  Filled 2018-04-23: qty 1

## 2018-04-23 MED ORDER — CHLORDIAZEPOXIDE HCL 25 MG PO CAPS
25.0000 mg | ORAL_CAPSULE | ORAL | Status: AC
  Administered 2018-04-25 – 2018-04-26 (×2): 25 mg via ORAL
  Filled 2018-04-23 (×2): qty 1

## 2018-04-23 MED ORDER — ONDANSETRON 4 MG PO TBDP
4.0000 mg | ORAL_TABLET | Freq: Once | ORAL | Status: AC
  Administered 2018-04-23: 4 mg via ORAL
  Filled 2018-04-23: qty 1

## 2018-04-23 MED ORDER — ONDANSETRON 4 MG PO TBDP: 4.0000 mg | ORAL_TABLET | Freq: Four times a day (QID) | ORAL | Status: DC | PRN

## 2018-04-23 MED ORDER — RISPERIDONE 1 MG PO TABS
1.0000 mg | ORAL_TABLET | Freq: Two times a day (BID) | ORAL | Status: DC
  Administered 2018-04-23 – 2018-04-27 (×8): 1 mg via ORAL
  Filled 2018-04-23 (×8): qty 1

## 2018-04-23 MED ORDER — CHLORDIAZEPOXIDE HCL 25 MG PO CAPS
50.0000 mg | ORAL_CAPSULE | Freq: Four times a day (QID) | ORAL | Status: DC
  Administered 2018-04-23 (×2): 50 mg via ORAL
  Filled 2018-04-23 (×2): qty 2

## 2018-04-23 MED ORDER — IBUPROFEN 600 MG PO TABS
600.0000 mg | ORAL_TABLET | Freq: Four times a day (QID) | ORAL | Status: DC | PRN
  Administered 2018-04-23 – 2018-04-26 (×2): 600 mg via ORAL
  Filled 2018-04-23 (×2): qty 1

## 2018-04-23 MED ORDER — GABAPENTIN 600 MG PO TABS
600.0000 mg | ORAL_TABLET | Freq: Three times a day (TID) | ORAL | Status: DC
  Administered 2018-04-23 – 2018-04-26 (×10): 600 mg via ORAL
  Filled 2018-04-23 (×10): qty 1

## 2018-04-23 MED ORDER — LORAZEPAM 2 MG/ML IJ SOLN
2.0000 mg | Freq: Once | INTRAMUSCULAR | Status: AC
  Administered 2018-04-23: 2 mg via INTRAMUSCULAR
  Filled 2018-04-23: qty 1

## 2018-04-23 MED ORDER — CHLORDIAZEPOXIDE HCL 25 MG PO CAPS
25.0000 mg | ORAL_CAPSULE | Freq: Three times a day (TID) | ORAL | Status: AC
  Administered 2018-04-24 – 2018-04-25 (×3): 25 mg via ORAL
  Filled 2018-04-23 (×3): qty 1

## 2018-04-23 MED ORDER — NAPROXEN 500 MG PO TABS
500.0000 mg | ORAL_TABLET | Freq: Two times a day (BID) | ORAL | Status: DC | PRN
  Filled 2018-04-23: qty 1

## 2018-04-23 MED ORDER — CHLORDIAZEPOXIDE HCL 25 MG PO CAPS
25.0000 mg | ORAL_CAPSULE | Freq: Four times a day (QID) | ORAL | Status: AC
  Administered 2018-04-23 – 2018-04-24 (×3): 25 mg via ORAL
  Filled 2018-04-23 (×3): qty 1

## 2018-04-23 MED ORDER — ADULT MULTIVITAMIN W/MINERALS CH
1.0000 | ORAL_TABLET | Freq: Every day | ORAL | Status: DC
  Administered 2018-04-23 – 2018-04-27 (×5): 1 via ORAL
  Filled 2018-04-23 (×5): qty 1

## 2018-04-23 MED ORDER — CHLORDIAZEPOXIDE HCL 25 MG PO CAPS
25.0000 mg | ORAL_CAPSULE | Freq: Every day | ORAL | Status: AC
  Administered 2018-04-27: 25 mg via ORAL
  Filled 2018-04-23: qty 1

## 2018-04-23 MED ORDER — ONDANSETRON 4 MG PO TBDP
4.0000 mg | ORAL_TABLET | Freq: Three times a day (TID) | ORAL | Status: DC | PRN
  Administered 2018-04-23 – 2018-04-26 (×6): 4 mg via ORAL
  Filled 2018-04-23 (×6): qty 1

## 2018-04-23 MED ORDER — CHLORDIAZEPOXIDE HCL 25 MG PO CAPS
25.0000 mg | ORAL_CAPSULE | Freq: Four times a day (QID) | ORAL | Status: AC | PRN
  Administered 2018-04-24: 25 mg via ORAL
  Filled 2018-04-23: qty 1

## 2018-04-23 MED ORDER — PANTOPRAZOLE SODIUM 40 MG PO TBEC
40.0000 mg | DELAYED_RELEASE_TABLET | Freq: Every day | ORAL | Status: DC
  Administered 2018-04-23 – 2018-04-27 (×5): 40 mg via ORAL
  Filled 2018-04-23 (×5): qty 1

## 2018-04-23 MED ORDER — PRAZOSIN HCL 1 MG PO CAPS
1.0000 mg | ORAL_CAPSULE | Freq: Every day | ORAL | Status: DC
  Administered 2018-04-23: 1 mg via ORAL
  Filled 2018-04-23 (×2): qty 1

## 2018-04-23 MED ORDER — LOPERAMIDE HCL 2 MG PO CAPS: 2.0000 mg | ORAL_CAPSULE | ORAL | Status: AC | PRN

## 2018-04-23 MED ORDER — METHOCARBAMOL 500 MG PO TABS
500.0000 mg | ORAL_TABLET | Freq: Three times a day (TID) | ORAL | Status: DC | PRN
  Administered 2018-04-24: 500 mg via ORAL
  Filled 2018-04-23 (×2): qty 1

## 2018-04-23 MED ORDER — HYDROXYZINE HCL 25 MG PO TABS
25.0000 mg | ORAL_TABLET | Freq: Four times a day (QID) | ORAL | Status: AC | PRN
  Administered 2018-04-24 – 2018-04-25 (×2): 25 mg via ORAL
  Filled 2018-04-23 (×2): qty 1

## 2018-04-23 NOTE — Progress Notes (Signed)
D: Patient stated slept fair last night .Stated appetite  good and energy level  normal. Stated concentration poor . Stated on Depression scale ,0 hopeless 0 and anxiety 6  .( low 0-10 high) Denies suicidal  homicidal ideations  .  No auditory hallucinations  No pain concerns . Appropriate ADL'S. Interacting with peers and staff. Information given clearly for better understanding . Patient is aware of disease concept . Patient able to identify lifestyle changes  and recourses presented  to her . Attending  unit programing , working on coping skills  and able to verbalize feelings Denies  auditory hallucination at this time . Compliant  with medication and understanding what they are . Presenting  Symptoms of detox  A: Encourage patient participation with unit programming . Instruction  Given on  Medication , verbalize understanding. R: Voice no other concerns. Staff continue to monitor

## 2018-04-23 NOTE — [facility] Suicide Risk Assessment (Signed)
[facility] INPATIENT:  Family/Significant Other Suicide Prevention Education  Suicide Prevention Education:  Education Completed;  Jacquenette ShoneJorge Chavez, pt's husband, at [phone] has been identified by the patient as the family member/significant other with whom the patient will be residing, and identified as the person(s) who will aid the patient in the event of a mental health crisis (suicidal ideations/suicide attempt).  With written consent from the patient, the family member/significant other has been provided the following suicide prevention education, prior to the and/or following the discharge of the patient.  The suicide prevention education provided includes the following:  Suicide risk factors  Suicide prevention and interventions  National Suicide Hotline telephone number  [facility] assessment telephone number  [facility] Emergency Assistance 911  [facility] and/or Residential Mobile Crisis Unit telephone number  Request made of family/significant other to:  Remove weapons (e.g., guns, rifles, knives), all items previously/currently identified as safety concern.    Remove drugs/medications (over-the-counter, prescriptions, illicit drugs), all items previously/currently identified as a safety concern.  The family member/significant other verbalizes understanding of the suicide prevention education information provided.  The family member/significant other agrees to remove the items of safety concern listed above.  Pt's husband added, "I think she got messed up with her medicine because she hasn't been taking it. I'm working all the time and I come home every other week--and she hasn't been taking her meds. Sometimes, I think she's bipolar--she'll flip, but she's okay. We've been together for 25 years." Pt's husband did not have any specific concerns, just emphasized the need for pt to get back on her medication. CSW confirmed pt does not have access to  weapons/other means of self harm, and is able to return home upon discharge. CSW will continue to coordinate with pt's husband as needed for updates/discharge planning.   Heidi DachKelsey Verdie Barrows, LCSW 04/23/2018, 1:03 PM

## 2018-04-23 NOTE — Tx Team (Addendum)
Interdisciplinary Treatment and Diagnostic Plan Update  04/23/2018 Time of Session: 1100 Yvonne Torres MRN: [id number]  Principal Diagnosis: <principal problem not specified>  Secondary Diagnoses: Active Problems:   Bipolar I disorder, current or most recent episode manic, with psychotic features (HCC)   Current Medications:  Current Facility-Administered Medications  Medication Dose Route Frequency Provider Last Rate Last Dose  . acetaminophen (TYLENOL) tablet 650 mg  650 mg Oral Q6H PRN Pucilowska, Jolanta B, MD      . alum & mag hydroxide-simeth (MAALOX/MYLANTA) 200-200-20 MG/5ML suspension 30 mL  30 mL Oral Q4H PRN Pucilowska, Jolanta B, MD      . chlordiazePOXIDE (LIBRIUM) capsule 25 mg  25 mg Oral Q6H PRN McNew, Holly R, MD      . chlordiazePOXIDE (LIBRIUM) capsule 25 mg  25 mg Oral QID McNew, Ileene HutchinsonHolly R, MD       Followed by  . [START ON 04/24/2018] chlordiazePOXIDE (LIBRIUM) capsule 25 mg  25 mg Oral TID Haskell RilingMcNew, Holly R, MD       Followed by  . [START ON 04/25/2018] chlordiazePOXIDE (LIBRIUM) capsule 25 mg  25 mg Oral BH-qamhs McNew, Ileene HutchinsonHolly R, MD       Followed by  . [START ON 04/27/2018] chlordiazePOXIDE (LIBRIUM) capsule 25 mg  25 mg Oral Daily McNew, Holly R, MD      . chlordiazePOXIDE (LIBRIUM) capsule 50 mg  50 mg Oral QID Pucilowska, Jolanta B, MD   50 mg at 04/23/18 0818  . hydrOXYzine (ATARAX/VISTARIL) tablet 25 mg  25 mg Oral Q6H PRN McNew, Holly R, MD      . lamoTRIgine (LAMICTAL) tablet 25 mg  25 mg Oral QHS Pucilowska, Jolanta B, MD   25 mg at 04/22/18 2139  . loperamide (IMODIUM) capsule 2-4 mg  2-4 mg Oral PRN McNew, Ileene HutchinsonHolly R, MD      . magnesium hydroxide (MILK OF MAGNESIA) suspension 30 mL  30 mL Oral Daily PRN Pucilowska, Jolanta B, MD      . multivitamin with minerals tablet 1 tablet  1 tablet Oral Daily McNew, Holly R, MD      . nicotine (NICODERM CQ - dosed in mg/24 hours) patch 21 mg  21 mg Transdermal Daily Pucilowska, Jolanta B, MD   21 mg at 04/23/18 0818  .  ondansetron (ZOFRAN-ODT) disintegrating tablet 4 mg  4 mg Oral Q8H PRN Pucilowska, Jolanta B, MD      . pantoprazole (PROTONIX) EC tablet 40 mg  40 mg Oral Daily Pucilowska, Jolanta B, MD   40 mg at 04/23/18 0818  . risperiDONE (RISPERDAL) tablet 2 mg  2 mg Oral BID Pucilowska, Jolanta B, MD   2 mg at 04/23/18 0818  . thiamine (B-1) injection 100 mg  100 mg Intramuscular Once McNew, Ileene HutchinsonHolly R, MD      . Melene Muller[START ON 04/24/2018] thiamine (VITAMIN B-1) tablet 100 mg  100 mg Oral Daily McNew, Ileene HutchinsonHolly R, MD      . traZODone (DESYREL) tablet 100 mg  100 mg Oral QHS PRN Pucilowska, Jolanta B, MD   100 mg at 04/22/18 2139   PTA Medications: Medications Prior to Admission  Medication Sig Dispense Refill Last Dose  . diphenhydrAMINE (BENADRYL) 25 MG tablet Take 1 tablet (25 mg total) by mouth every 6 (six) hours as needed. 30 tablet 0   . EPINEPHrine 0.3 mg/0.3 mL IJ SOAJ injection Inject 0.3 mLs (0.3 mg total) into the muscle once. Follow package instructions as needed for severe allergy or anaphylactic reaction. 1  Device 2   . LamoTRIgine 50 MG TBDP Take 50 mg by mouth 3 (three) times daily.   09/22/2015 at pm    Patient Stressors: Legal issue Medication change or noncompliance Substance abuse  Patient Strengths: Ability for insight Average or above average intelligence Communication skills Supportive family/friends  Treatment Modalities: Medication Management, Group therapy, Case management,  1 to 1 session with clinician, Psychoeducation, Recreational therapy.   Physician Treatment Plan for Primary Diagnosis: <principal problem not specified> Long Term Goal(s):     Short Term Goals:    Medication Management: Evaluate patient's response, side effects, and tolerance of medication regimen.  Therapeutic Interventions: 1 to 1 sessions, Unit Group sessions and Medication administration.  Evaluation of Outcomes: Progressing  Physician Treatment Plan for Secondary Diagnosis: Active Problems:    Bipolar I disorder, current or most recent episode manic, with psychotic features (HCC)  Long Term Goal(s):     Short Term Goals:       Medication Management: Evaluate patient's response, side effects, and tolerance of medication regimen.  Therapeutic Interventions: 1 to 1 sessions, Unit Group sessions and Medication administration.  Evaluation of Outcomes: Progressing   RN Treatment Plan for Primary Diagnosis: <principal problem not specified> Long Term Goal(s): Knowledge of disease and therapeutic regimen to maintain health will improve  Short Term Goals: Ability to verbalize feelings will improve, Ability to identify and develop effective coping behaviors will improve and Compliance with prescribed medications will improve  Medication Management: RN will administer medications as ordered by provider, will assess and evaluate patient's response and provide education to patient for prescribed medication. RN will report any adverse and/or side effects to prescribing provider.  Therapeutic Interventions: 1 on 1 counseling sessions, Psychoeducation, Medication administration, Evaluate responses to treatment, Monitor vital signs and CBGs as ordered, Perform/monitor CIWA, COWS, AIMS and Fall Risk screenings as ordered, Perform wound care treatments as ordered.  Evaluation of Outcomes: Progressing   LCSW Treatment Plan for Primary Diagnosis: <principal problem not specified> Long Term Goal(s): Safe transition to appropriate next level of care at discharge, Engage patient in therapeutic group addressing interpersonal concerns.  Short Term Goals: Engage patient in aftercare planning with referrals and resources, Increase emotional regulation and Increase skills for wellness and recovery  Therapeutic Interventions: Assess for all discharge needs, 1 to 1 time with Social worker, Explore available resources and support systems, Assess for adequacy in community support network, Educate family and  significant other(s) on suicide prevention, Complete Psychosocial Assessment, Interpersonal group therapy.  Evaluation of Outcomes: Progressing   Progress in Treatment: Attending groups: Yes. Participating in groups: Yes. Taking medication as prescribed: Yes. Toleration medication: Yes. Family/Significant other contact made: No, will contact:  pt's husband Patient understands diagnosis: Yes. Discussing patient identified problems/goals with staff: Yes. Medical problems stabilized or resolved: Yes. Denies suicidal/homicidal ideation: Yes. Issues/concerns per patient self-inventory: No. Other: None at this time.   New problem(s) identified: No, Describe:  None at this time  New Short Term/Long Term Goal(s): Pt will demonstrate improved by sleeping at least six hours per night prior to discharge, and will report 0 auditory hallucinations prior to discharge.   Patient Goals:  Pt reported her goal for treatment is, "To get back on my medications correctly and have supports in place for when I'm discharged."   Discharge Plan or Barriers: Pt will discharge home and will continue tx in the outpatient setting with [facility].   Reason for Continuation of Hospitalization: Hallucinations Medication stabilization  Estimated Length of Stay: 3-5  days  Recreational Therapy: Patient Stressors: Family Patient Goal: Patient will identify 3 positive coping skills strategies to use post d/c within 5 recreation therapy group sessions  Attendees: Patient: Yvonne Torres 04/23/2018 11:23 AM  Physician: Dr. Johnella MoloneyMcNew, MD 04/23/2018 11:23 AM  Nursing: Hulan AmatoGwen Farrish, RN 04/23/2018 11:23 AM  RN Care Manager: 04/23/2018 11:23 AM  Social Worker: Heidi DachKelsey Craig, LCSW 04/23/2018 11:23 AM  Recreational Therapist: Garret ReddishShay Kaydyn Sayas, CTRS-LRT 04/23/2018 11:23 AM  Other: Johny Shearsassandra Jarrett, LCSWA 04/23/2018 11:23 AM  Other: Jake SharkSara Laws, LCSW 04/23/2018 11:23 AM  Other: 04/23/2018 11:23 AM    Scribe for Treatment Team: Heidi DachKelsey  Craig, LCSW 04/23/2018 11:23 AM

## 2018-04-23 NOTE — Progress Notes (Signed)
Recreation Therapy Notes   Date: 04/23/2018  Time: 9:30 am   Location: Craft Room   Behavioral response: N/A   Intervention Topic: Happiness  Discussion/Intervention: Patient did not attend group.   Clinical Observations/Feedback:  Patient did not attend group.   Nadirah Socorro LRT/CTRS        Denean Pavon 04/23/2018 1:46 PM
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
## 2018-04-23 NOTE — [facility] Suicide Risk Assessment (Signed)
[facility]   Nursing information obtained from:  Patient Demographic factors:  Caucasian, Unemployed Current Mental Status:  NA Loss Factors:  Legal issues Historical Factors:  NA Risk Reduction Factors:  Positive social support, Living with another person, especially a relative  Total Time spent with patient: 70 mins Principal Problem: Bipolar I disorder, current or most recent episode manic, with psychotic features (HCC) Diagnosis:   Patient Active Problem List   Diagnosis Date Noted  . Use of nonprescription opiate drugs (HCC) [F11.10] 04/23/2018    Priority: High  . Bipolar I disorder, current or most recent episode manic, with psychotic features (HCC) [F31.2] 04/22/2018    Priority: High  . Alcohol use disorder, moderate, dependence (HCC) [F10.20] 04/22/2018    Priority: High  . PTSD (post-traumatic stress disorder) [F43.10] 04/22/2018    Priority: High  . OCD (obsessive compulsive disorder) [F42.9] 04/22/2018  . Tobacco use disorder [F17.200] 04/22/2018  . Cocaine use disorder, moderate, dependence (HCC) [F14.20] 04/22/2018   Subjective Data: See H&P  Continued Clinical Symptoms:  Alcohol Use Disorder Identification Test Final Score (AUDIT): 29 The "Alcohol Use Disorders Identification Test", Guidelines for Use in Primary Care, Second Edition.  World Science writerHealth Organization [facility](WHO). Score between 0-7:  no or low risk or alcohol related problems. Score between 8-15:  moderate risk of alcohol related problems. Score between 16-19:  high risk of alcohol related problems. Score 20 or above:  warrants further diagnostic evaluation for alcohol dependence and treatment.   CLINICAL FACTORS:   Severe Anxiety and/or Agitation Bipolar Disorder:   Mixed State Personality Disorders:   Cluster B More than one psychiatric diagnosis      COGNITIVE FEATURES THAT CONTRIBUTE TO RISK:  None    SUICIDE RISK:   Minimal: No identifiable suicidal ideation.   Patients presenting with no risk factors but with morbid ruminations; may be classified as minimal risk based on the severity of the depressive symptoms  PLAN OF CARE: See h&P  I certify that inpatient services furnished can reasonably be expected to improve the patient's condition.   Haskell RilingHolly R McNew, MD 04/23/2018, 12:05 PM

## 2018-04-23 NOTE — Progress Notes (Signed)
Inpatient Diabetes Program Recommendations  AACE/ADA: New Consensus Statement on Inpatient Glycemic Control (2015)  Target Ranges:  Prepandial:   less than 140 mg/dL      Peak postprandial:   less than 180 mg/dL (1-2 hours)      Critically ill patients:  140 - 180 mg/dL   Results for Yvonne Torres, Yvonne Torres (MRN [id number]) as of 04/23/2018 15:42  Ref. Range 04/21/2018 14:50  Glucose Latest Ref Range: 70 - 99 mg/dL 956221 (H)   Results for Yvonne Torres, Yvonne Torres (MRN [id number]) as of 04/23/2018 15:42  Ref. Range 04/23/2018 06:51  Hemoglobin A1C Latest Ref Range: 4.8 - 5.6 % 8.1 (H)    Admit with: Mood swings, depression, insomnia, polysubstance abuse  History: DM   Home DM Meds: None  Current Orders: None     Per Dr. Willia CrazePucilowska's Consult note on 08/13, patient with History of Type 2 DM.  Please consider placing orders for:  1. Novolog Sensitive Correction Scale/ SSI (0-9 units) TID AC + HS  2. Metformin 500 mg BID     --Will follow patient during hospitalization--  Ambrose FinlandJeannine Johnston Blessing Zaucha RN, MSN, CDE Diabetes Coordinator Inpatient Glycemic Control Team Team Pager: [phone] (8a-5p)

## 2018-04-23 NOTE — Progress Notes (Signed)
Nutrition Brief Note  Patient identified on the Malnutrition Screening Tool (MST) Report  47 y/o female with Bipolar I disorder, current or most recent episode manic, with psychotic features   Wt Readings from Last 15 Encounters:  04/22/18 99.8 kg  04/21/18 100.6 kg  04/05/18 100.7 kg  02/10/18 95.7 kg  10/16/17 101.6 kg  06/13/17 96.6 kg  11/22/16 96.6 kg  08/20/16 97.5 kg  12/07/15 96.6 kg  09/21/15 99.8 kg  07/15/15 100.7 kg    Body mass index is 38.97 kg/m. Patient meets criteria for obesity based on current BMI.   Current diet order is regular, patient is consuming approximately 100% of meals at this time. Labs and medications reviewed. Pt with h/o drug and etoh abuse; already ordered for thiamine and MVI.  No nutrition interventions warranted at this time. If nutrition issues arise, please consult RD.   Betsey Holidayasey Capria Cartaya MS, RD, LDN Pager #- [phone] Office#- [phone] After Hours Pager: [phone]

## 2018-04-23 NOTE — Plan of Care (Addendum)
Patient found in common area upon my arrival. Patient is visible and social this evening. Patient requests that some of her valuables be given to her husband. Security brought valuables in and states that they are unable to take any of the valuable back. Patient gave husband $300, her M/C, and her rings. Her wallet, Visa Debit, and $57 were placed in her locker upon her request. All paperwork placed in chart. Patient mood is depressed. Affect appropriate. Patient is cooperative and polite. Denies SI/HI/AVH at this time. Reports hallucinations reported earlier are intermittent in nature. Reports eating and voiding adequately. No observable S/Sx of WD at this time.  Compliant with HS medications and staff direction. Given Trazodone for sleep with positive results. Q 15 minute checks maintained. Will continue to monitor throughout the shift. Patient slept 7.75 hours. No apparent distress. Will endorse care to oncoming shift.  Problem: Coping: Goal: Coping ability will improve Outcome: Progressing Goal: Will verbalize feelings Outcome: Progressing   Problem: Education: Goal: Will be free of psychotic symptoms Outcome: Progressing Goal: Knowledge of the prescribed therapeutic regimen will improve Outcome: Progressing   Problem: Education: Goal: Knowledge of General Education information will improve Description Including pain rating scale, medication(s)/side effects and non-pharmacologic comfort measures Outcome: Progressing
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
## 2018-04-23 NOTE — Plan of Care (Signed)
Information given clearly for better understanding . Patient is aware of disease concept . Patient able to identify lifestyle changes  and recourses presented  to her . Attending  unit programing , working on coping skills  and able to verbalize feelings Denies  auditory hallucination at this time . Compliant  with medication and understanding what they are    Problem: Education: Goal: Knowledge of [facility] Education information/materials will improve Outcome: Progressing   Problem: Education: Goal: Knowledge of disease or condition will improve Outcome: Progressing Goal: Understanding of discharge needs will improve Outcome: Progressing   Problem: Health Behavior/Discharge Planning: Goal: Ability to identify changes in lifestyle to reduce recurrence of condition will improve Outcome: Progressing Goal: Identification of resources available to assist in meeting health care needs will improve Outcome: Progressing   Problem: Coping: Goal: Coping ability will improve Outcome: Progressing Goal: Will verbalize feelings Outcome: Progressing   Problem: Education: Goal: Will be free of psychotic symptoms Outcome: Progressing Goal: Knowledge of the prescribed therapeutic regimen will improve Outcome: Progressing   Problem: Education: Goal: Knowledge of General Education information will improve Description Including pain rating scale, medication(s)/side effects and non-pharmacologic comfort measures Outcome: Progressing

## 2018-04-24 LAB — GLUCOSE, CAPILLARY
GLUCOSE-CAPILLARY: 276 mg/dL — AB (ref 70–99)
Glucose-Capillary: 300 mg/dL — ABNORMAL HIGH (ref 70–99)
Glucose-Capillary: 230 mg/dL — ABNORMAL HIGH (ref 70–99)
Glucose-Capillary: 272 mg/dL — ABNORMAL HIGH (ref 70–99)

## 2018-04-24 MED ORDER — METFORMIN HCL 500 MG PO TABS
500.0000 mg | ORAL_TABLET | Freq: Two times a day (BID) | ORAL | Status: DC
  Filled 2018-04-24: qty 1

## 2018-04-24 MED ORDER — PRAZOSIN HCL 2 MG PO CAPS
2.0000 mg | ORAL_CAPSULE | Freq: Every day | ORAL | Status: DC
  Administered 2018-04-24 – 2018-04-26 (×3): 2 mg via ORAL
  Filled 2018-04-24 (×3): qty 1

## 2018-04-24 MED ORDER — INSULIN ASPART 100 UNIT/ML ~~LOC~~ SOLN
0.0000 [IU] | Freq: Three times a day (TID) | SUBCUTANEOUS | Status: DC
  Administered 2018-04-25: 7 [IU] via SUBCUTANEOUS
  Administered 2018-04-25: 3 [IU] via SUBCUTANEOUS
  Administered 2018-04-26: 2 [IU] via SUBCUTANEOUS
  Administered 2018-04-26: 5 [IU] via SUBCUTANEOUS
  Administered 2018-04-26 – 2018-04-27 (×2): 3 [IU] via SUBCUTANEOUS
  Filled 2018-04-24: qty 1

## 2018-04-24 MED ORDER — INSULIN ASPART 100 UNIT/ML ~~LOC~~ SOLN
0.0000 [IU] | Freq: Every day | SUBCUTANEOUS | Status: DC
  Administered 2018-04-25 – 2018-04-26 (×2): 2 [IU] via SUBCUTANEOUS
  Filled 2018-04-24 (×2): qty 1

## 2018-04-24 MED ORDER — LINAGLIPTIN 5 MG PO TABS
5.0000 mg | ORAL_TABLET | Freq: Every day | ORAL | Status: DC
  Administered 2018-04-25 – 2018-04-27 (×3): 5 mg via ORAL
  Filled 2018-04-24 (×3): qty 1

## 2018-04-24 MED ORDER — LIVING WELL WITH DIABETES BOOK
Freq: Once | Status: AC
  Administered 2018-04-24: 19:00:00
  Filled 2018-04-24: qty 1

## 2018-04-24 NOTE — Progress Notes (Signed)
Recreation Therapy Notes   Date: 04/24/2018  Time: 9:30 am  Location: Craft Room  Behavioral response: Appropriate  Intervention Topic: Coping Skills   Discussion/Intervention:  Group content on today was focused on coping skills. The group defined what coping skills are and when they can be used. Individuals described how they normally cope with thing and the coping skills they normally use. Patients expressed why it is important to cope with things and how not coping with things can affect you. The group participated in the intervention "My coping box" and made coping boxes while adding coping skills they could use in the future to the box. Clinical Observations/Feedback:  Patient came to group late due to unknown reasons. She was focused on what peers and staff had to say about coping skills. Individual was social with peers and staff while participating in the intervention.   Salote Weidmann LRT/CTRS          Daniell Paradise 04/24/2018 11:09 AM

## 2018-04-24 NOTE — Progress Notes (Signed)
D- Patient alert and oriented. Patient presents in a pleasant mood on assessment stating that she slept "on and off" last night and had no major complaints to report to this Clinical research associatewriter. Patient rated her pain level a "5/10" in her legs, but did not request any more pain medication from this writer because she had some earlier this morning. Patient denies SI, HI, AVH, at this time. Patient also denies any signs/symptoms of depression, however, she does rate her anxiety a "5/10" stating that "everything" is making her feel this way. Patient's goal for today is to "go to a couple of groups".  A- Scheduled medications administered to patient, per MD orders. Support and encouragement provided.  Routine safety checks conducted every 15 minutes.  Patient informed to notify staff with problems or concerns.  R- No adverse drug reactions noted. Patient contracts for safety at this time. Patient compliant with medications and treatment plan. Patient receptive, calm, and cooperative. Patient interacts well with others on the unit.  Patient remains safe at this time.

## 2018-04-24 NOTE — Progress Notes (Signed)
Patient continues to refuse meal coverage insulin because she is not on it at home. MD has been notified.

## 2018-04-24 NOTE — Plan of Care (Addendum)
Patient found in common area with visitors upon my arrival. Patient is visible and social this evening. Previous shift RN notified me that patient had a headache but nothing to take. Contacted Dr. Toni Amendlapacs. Ordered Motrin 600mg  for headache. Patient reports improvement. Complains of nausea, given Zofran with positive results as well. Patient is currently suffering from WD but is continuing to participate. Attended group and is interacting positively with staff and peers. Patient expresses hopefulness for the future. Denies SI/HI/AVH. Shared with me that her husband has moved back into their home. Polite, pleasant, and cooperative. Reports eating and voiding adequately. CIWA is currently 11. Remains on Librium. Compliant with HS medication and staff direction. Q 15 minute checks maintained. Will continue to monitor throughout the shift. @0435 , patient awake actively vomiting. Given Zofran SL. Given gingerale and ice chips. Patient complains of left leg cramping and S/Sx of WD. After Zofran takes effect, patient given Librium and Robaxin. VSS. CIWA 14. Provided encouragement in her goal towards detox and recovery. Patient responds positively. Will continue to monitor. Patient slept 6 hours total. Restless at times. Will endorse care to oncoming shift.  Problem: Education: Goal: Knowledge of [facility] Education information/materials will improve Outcome: Progressing   Problem: Education: Goal: Knowledge of disease or condition will improve Outcome: Progressing   Problem: Health Behavior/Discharge Planning: Goal: Ability to identify changes in lifestyle to reduce recurrence of condition will improve Outcome: Progressing   Problem: Coping: Goal: Coping ability will improve Outcome: Progressing Goal: Will verbalize feelings Outcome: Progressing   Problem: Education: Goal: Knowledge of the prescribed therapeutic regimen will improve Outcome: Progressing   Problem: Education: Goal: Will  be free of psychotic symptoms Outcome: Not Applicable

## 2018-04-24 NOTE — Progress Notes (Signed)
[facility] Note  04/24/2018 2:38 PM Yvonne CoderHeather Torres  MRN:  [id number] Subjective:  Pt had episode of vomiting last night. She was given fluids and Zofran which was helpful. She is feeling better today. She is out of her room and socializing with peers well. She stats that she is glad to be back on her medications. The voices are much quieter and she can barely hear them. She denies SI and states, "I was never suicidal." She is still having withdrawal symptoms of nausea and feeling shake but these are improving. She states that she was happy taht someone talked to her husband about her medications. She asks for a chart with her medications to help her remember when to take them. Diabetes care manager did recommend Metformin for her. She states that she is unable to take metformin because they made her LFTs elevated. She was told by her PCP to not take it. She states taht she wanted to try to control it with diet but she never had any one teach her about diabetes and diet. She has bright affect and is very forthcoming with information. She continues to have nightmares at night related to past trauma.    Principal Problem: Bipolar I disorder, current or most recent episode manic, with psychotic features (HCC) Diagnosis:   Patient Active Problem List   Diagnosis Date Noted  . Use of nonprescription opiate drugs (HCC) [F11.10] 04/23/2018    Priority: High  . Bipolar I disorder, current or most recent episode manic, with psychotic features (HCC) [F31.2] 04/22/2018    Priority: High  . Alcohol use disorder, moderate, dependence (HCC) [F10.20] 04/22/2018    Priority: High  . PTSD (post-traumatic stress disorder) [F43.10] 04/22/2018    Priority: High  . OCD (obsessive compulsive disorder) [F42.9] 04/22/2018  . Tobacco use disorder [F17.200] 04/22/2018  . Cocaine use disorder, moderate, dependence (HCC) [F14.20] 04/22/2018   Total Time spent with patient: 20 minutes  Past Psychiatric History:  See h&p  Past Medical History:  Past Medical History:  Diagnosis Date  . Bipolar 1 disorder (HCC)   . Diabetes mellitus without complication (HCC)   . Opioid dependence in remission [facility](HCC) 2019   Reports she went through residential treatment program and is clean as of June 2019    Past Surgical History:  Procedure Laterality Date  . ECTOPIC PREGNANCY SURGERY    . TONSILLECTOMY     Family History:  Family History  Problem Relation Age of Onset  . Breast cancer Mother 4638   Family Psychiatric  History: See h&P Social History:  Social History   Substance and Sexual Activity  Alcohol Use Yes     Social History   Substance and Sexual Activity  Drug Use Not on file    Social History   Socioeconomic History  . Marital status: Single    Spouse name: Not on file  . Number of children: Not on file  . Years of education: Not on file  . Highest education level: Not on file  Occupational History  . Not on file  Social Needs  . Financial resource strain: Not on file  . Food insecurity:    Worry: Not on file    Inability: Not on file  . Transportation needs:    Medical: Not on file    Non-medical: Not on file  Tobacco Use  . Smoking status: Current Some Day Smoker    Packs/day: 0.25    Types: Cigarettes  . Smokeless tobacco: Never Used  Substance and Sexual Activity  . Alcohol use: Yes  . Drug use: Not on file  . Sexual activity: Not on file  Lifestyle  . Physical activity:    Days per week: Not on file    Minutes per session: Not on file  . Stress: Not on file  Relationships  . Social connections:    Talks on phone: Not on file    Gets together: Not on file    Attends religious service: Not on file    Active member of club or organization: Not on file    Attends meetings of clubs or organizations: Not on file    Relationship status: Not on file  Other Topics Concern  . Not on file  Social History Narrative  . Not on file   Additional Social History:                          Sleep: Fair  Appetite:  Fair  Current Medications: Current Facility-Administered Medications  Medication Dose Route Frequency Provider Last Rate Last Dose  . alum & mag hydroxide-simeth (MAALOX/MYLANTA) 200-200-20 MG/5ML suspension 30 mL  30 mL Oral Q4H PRN Pucilowska, Jolanta B, MD      . chlordiazePOXIDE (LIBRIUM) capsule 25 mg  25 mg Oral Q6H PRN Braydan Marriott, Ileene Hutchinson, MD   25 mg at 04/24/18 0505  . chlordiazePOXIDE (LIBRIUM) capsule 25 mg  25 mg Oral TID Haskell Riling, MD   25 mg at 04/24/18 1249   Followed by  . [START ON 04/25/2018] chlordiazePOXIDE (LIBRIUM) capsule 25 mg  25 mg Oral BH-qamhs Kalif Kattner, Ileene Hutchinson, MD       Followed by  . [START ON 04/27/2018] chlordiazePOXIDE (LIBRIUM) capsule 25 mg  25 mg Oral Daily Adi Doro R, MD      . dicyclomine (BENTYL) tablet 20 mg  20 mg Oral Q6H PRN Kristl Morioka R, MD      . gabapentin (NEURONTIN) tablet 600 mg  600 mg Oral TID Haskell Riling, MD   600 mg at 04/24/18 1248  . hydrOXYzine (ATARAX/VISTARIL) tablet 25 mg  25 mg Oral Q6H PRN Maryalice Pasley, Ileene Hutchinson, MD      . ibuprofen (ADVIL,MOTRIN) tablet 600 mg  600 mg Oral Q6H PRN Clapacs, Jackquline Denmark, MD   600 mg at 04/23/18 2045  . insulin aspart (novoLOG) injection 0-5 Units  0-5 Units Subcutaneous QHS Gevork Ayyad R, MD      . insulin aspart (novoLOG) injection 0-9 Units  0-9 Units Subcutaneous TID WC Antwione Picotte R, MD      . lamoTRIgine (LAMICTAL) tablet 25 mg  25 mg Oral QHS Pucilowska, Jolanta B, MD   25 mg at 04/23/18 2046  . [START ON 04/25/2018] linagliptin (TRADJENTA) tablet 5 mg  5 mg Oral Daily Cyncere Ruhe R, MD      . loperamide (IMODIUM) capsule 2-4 mg  2-4 mg Oral PRN Oniya Mandarino, Ileene Hutchinson, MD      . magnesium hydroxide (MILK OF MAGNESIA) suspension 30 mL  30 mL Oral Daily PRN Pucilowska, Jolanta B, MD      . methocarbamol (ROBAXIN) tablet 500 mg  500 mg Oral Q8H PRN Jenette Rayson, Ileene Hutchinson, MD   500 mg at 04/24/18 0505  . multivitamin with minerals tablet 1 tablet  1 tablet Oral Daily  Glendal Cassaday, Ileene Hutchinson, MD   1 tablet at 04/24/18 0841  . nicotine (NICODERM CQ - dosed in mg/24 hours) patch 21 mg  21  mg Transdermal Daily Pucilowska, Jolanta B, MD   21 mg at 04/24/18 0843  . ondansetron (ZOFRAN-ODT) disintegrating tablet 4 mg  4 mg Oral Q8H PRN Pucilowska, Jolanta B, MD   4 mg at 04/24/18 0447  . pantoprazole (PROTONIX) EC tablet 40 mg  40 mg Oral Daily Pucilowska, Jolanta B, MD   40 mg at 04/24/18 0841  . prazosin (MINIPRESS) capsule 1 mg  1 mg Oral QHS Ronne Savoia, Ileene HutchinsonHolly R, MD   1 mg at 04/23/18 2045  . risperiDONE (RISPERDAL) tablet 1 mg  1 mg Oral BID Haskell RilingMcNew, Trany Chernick R, MD   1 mg at 04/24/18 0841  . thiamine (VITAMIN B-1) tablet 100 mg  100 mg Oral Daily Marla Pouliot, Ileene HutchinsonHolly R, MD   100 mg at 04/24/18 0841  . traZODone (DESYREL) tablet 100 mg  100 mg Oral QHS PRN Pucilowska, Jolanta B, MD   100 mg at 04/23/18 2046    Lab Results:  Results for orders placed or performed during the hospital encounter of 04/22/18 (from the past 48 hour(s))  Hemoglobin A1c     Status: Abnormal   Collection Time: 04/23/18  6:51 AM  Result Value Ref Range   Hgb A1c MFr Bld 8.1 (H) 4.8 - 5.6 %    Comment: (NOTE) Pre diabetes:          5.7%-6.4% Diabetes:              >6.4% Glycemic control for   <7.0% adults with diabetes    Mean Plasma Glucose 185.77 mg/dL    Comment: Performed at [facility], [address] [place], [place] [address]  Lipid panel     Status: Abnormal   Collection Time: 04/23/18  6:51 AM  Result Value Ref Range   Cholesterol 187 0 - 200 mg/dL   Triglycerides 191332 (H) <150 mg/dL   HDL 42 >47>40 mg/dL   Total CHOL/HDL Ratio 4.5 RATIO   VLDL 66 (H) 0 - 40 mg/dL   LDL Cholesterol 79 0 - 99 mg/dL    Comment:        Total Cholesterol/HDL:CHD Risk Coronary Heart Disease Risk Table                     Men   Women  1/2 Average Risk   3.4   3.3  Average Risk       5.0   4.4  2 X Average Risk   9.6   7.1  3 X Average Risk  23.4   11.0        Use the calculated Patient Ratio above and  the CHD Risk Table to determine the patient's CHD Risk.        ATP III CLASSIFICATION (LDL):  <100     mg/dL   Optimal  829-562100-129  mg/dL   Near or Above                    Optimal  130-159  mg/dL   Borderline  130-865160-189  mg/dL   High  >784>190     mg/dL   Very High Performed at [facility], [address] [place], [place] [address]   TSH     Status: None   Collection Time: 04/23/18  6:51 AM  Result Value Ref Range   TSH 3.984 0.350 - 4.500 uIU/mL    Comment: Performed by a 3rd Generation assay with a functional sensitivity of <=0.01 uIU/mL. Performed at [facility], [phone] [place], [place] [address]   Glucose, capillary     Status: Abnormal   Collection Time: 04/24/18  8:46 AM  Result Value Ref Range   Glucose-Capillary 300 (H) 70 - 99 mg/dL  Glucose, capillary     Status: Abnormal   Collection Time: 04/24/18 11:38 AM  Result Value Ref Range   Glucose-Capillary 230 (H) 70 - 99 mg/dL    Blood Alcohol level:  Lab Results  Component Value Date   ETH 13 (H) 04/21/2018    Metabolic Disorder Labs: Lab Results  Component Value Date   HGBA1C 8.1 (H) 04/23/2018   MPG 185.77 04/23/2018   No results found for: PROLACTIN Lab Results  Component Value Date   CHOL 187 04/23/2018   TRIG 332 (H) 04/23/2018   HDL 42 04/23/2018   CHOLHDL 4.5 04/23/2018   VLDL 66 (H) 04/23/2018   LDLCALC 79 04/23/2018    Physical Findings: AIMS:  , ,  ,  ,    CIWA:  CIWA-Ar Total: 0 COWS:     Musculoskeletal: Strength & Muscle Tone: within normal limits Gait & Station: normal Patient leans: N/A  Psychiatric Specialty Exam: Physical Exam  ROS  Blood pressure 125/71, pulse 88, temperature 98.6 F (37 C), resp. rate 18, height 5\' 3"  (1.6 m), weight 99.8 kg, last menstrual period 04/05/2018, SpO2 97 %.Body mass index is 38.97 kg/m.  General Appearance: Casual  Eye Contact:  Good  Speech:  Clear and Coherent  Volume:  Normal  Mood:  Euthymic  Affect:   Appropriate  Thought Process:  Coherent and Goal Directed  Orientation:  Full (Time, Place, and Person)  Thought Content:  Logical  Suicidal Thoughts:  No  Homicidal Thoughts:  No  Memory:  Immediate;   Fair  Judgement:  Fair  Insight:  Fair  Psychomotor Activity:  Normal  Concentration:  Concentration: Fair  Recall:  [employer] of Knowledge:  Fair  Language:  Fair  Akathisia:  No      Assets:  Resilience  ADL's:  Intact  Cognition:  WNL  Sleep:  Number of Hours: 6     Treatment Plan Summary: 47 yo female admitted due to AH, mood instability and substance abuse. She is still having some w/d symptoms including nausea and vomiting. She is drinking plenty of fluids and eating okay. She feels her medications are helping and AH are resolving. She also feels her mood is more stable. Denies SI.  Plan:  Mood disorder -Continue Risperdal 1 mg BID -Continue Lamictal 25 mg daily  PTSD nightmares -Increase to 2 mg qhs  Alcohol use disorder -Continue Gabapentin 600 mg TID -Continue CIWA-was scoring 0 so far today -continue Librium taper  Opiate use disorder -Continue Supportive measures  Elevated LFTs -likely from alcohol use  DM -She is unable to take metformin due to elevated LFTs (per patient) - I spoke with diabetes care manager who suggested switching to Tradjenta 5 mg daily. She will also try to come and do some diabetes education with patient.   Dispo -She will return home when stable and follow up with [facility] Haskell Riling, MD 04/24/2018, 2:38 PM

## 2018-04-24 NOTE — Progress Notes (Addendum)
Inpatient Diabetes Program Recommendations  AACE/ADA: New Consensus Statement on Inpatient Glycemic Control (2015)  Target Ranges:  Prepandial:   less than 140 mg/dL      Peak postprandial:   less than 180 mg/dL (1-2 hours)      Critically ill patients:  140 - 180 mg/dL   Results for Yvonne Torres, Yvonne Torres (MRN [id number]) as of 04/24/2018 16:33  Ref. Range 04/24/2018 08:46 04/24/2018 11:38  Glucose-Capillary Latest Ref Range: 70 - 99 mg/dL 409300 (H) 811230 (H)    Admit with: Mood swings, depression, insomnia, polysubstance abuse  History: DM   Home DM Meds: None  Current Orders: Novolog Sensitive Correction Scale/ SSI (0-9 units) TID AC + HS      Tradjenta 5 mg daily    Dr. Johnella MoloneyMcNew-  Patient requests Rx for CBG meter at time of discharge  Order # [id number]  Refusing to take Insulin in hospital setting.  Per Dr. Johnella MoloneyMcNew, cannot take Metformin due to the medication causing elevated liver enzymes for her when she previously took it in the past.  Recommended to Dr. Johnella MoloneyMcNew that we could try Tradejnta 5 mg daily instead.  Patient requests information on diet for diabetes.  Discussed basic DM diet information with patient.  Encouraged patient to avoid beverages with sugar (regular soda, sweet tea, lemonade, fruit juice) and to consume mostly water.  Discussed what foods contain carbohydrates and how carbohydrates affect the body's blood sugar levels.  Encouraged patient to be careful with her portion sizes (especially grains, starchy vegetables, and fruits).  Explained that women should have 45-60 grams of carbohydrates per meal per day.  Gave patient examples of healthy low carb snacks and encouraged pt to follow up with her PCP for further care of her elevated blood sugar levels.  Ordered pt the Living Well with Diabetes book for personal use and gave pt a detailed brochure on healthy eating with diabetes at home.  Patient stated she [facility].  Encouraged pt to purchase a CBG meter [facility] and suggested she start spot checking her CBGs at home.  Reviewed CBG goals for home with pt as well.  Information placed on AVS as well.     --Will follow patient during hospitalization--  Ambrose FinlandJeannine Johnston Declan Adamson RN, MSN, CDE Diabetes Coordinator Inpatient Glycemic Control Team Team Pager: [phone] (8a-5p)

## 2018-04-24 NOTE — Plan of Care (Signed)
Patient verbalizes understanding of her condition as well as the general information that's been provided to her and all questions/concerns have been addressed and answered. Patient verbalizes understanding of her discharge needs and has the ability to identify the changes that need to be made in her lifestyle in order to prevent recurrence. Patient has identified the available resources that can assist her in meeting her health-care needs. Patient has been coping well by attending/participating in unit groups and has not had any issues thus far. Patient has verbalized understanding of and has been in compliance with her prescribed therapeutic regimen. Patient remains safe on the unit at this time.  Problem: Education: Goal: Knowledge of  General Education information/materials will improve Outcome: Progressing   Problem: Education: Goal: Knowledge of disease or condition will improve Outcome: Progressing Goal: Understanding of discharge needs will improve Outcome: Progressing   Problem: Health Behavior/Discharge Planning: Goal: Ability to identify changes in lifestyle to reduce recurrence of condition will improve Outcome: Progressing Goal: Identification of resources available to assist in meeting health care needs will improve Outcome: Progressing   Problem: Coping: Goal: Coping ability will improve Outcome: Progressing Goal: Will verbalize feelings Outcome: Progressing   Problem: Education: Goal: Knowledge of the prescribed therapeutic regimen will improve Outcome: Progressing   Problem: Education: Goal: Knowledge of General Education information will improve Description Including pain rating scale, medication(s)/side effects and non-pharmacologic comfort measures Outcome: Progressing

## 2018-04-24 NOTE — Progress Notes (Signed)
Recreation Therapy Notes  INPATIENT RECREATION THERAPY ASSESSMENT  Patient Details Name: Yvonne Torres MRN: [id number] DOB: October 07, 1970 Today's Date: 04/24/2018       Information Obtained From: Patient  Able to Participate in Assessment/Interview: Yes  Patient Presentation: Responsive  Reason for Admission (Per Patient): Substance Abuse  Patient Stressors: Family  Coping Skills:   Other (Comment)(Puppies, Driving, AA meetings, hang with family)  Leisure Interests (2+):  Games - Cards([employer])  Frequency of Recreation/Participation: Marketing executiveWeekly  Awareness of Community Resources:  Yes  Community Resources:  [employer], [place]  Current Use: Yes  If no, Barriers?:    Expressed Interest in [employer] Information: Yes(Patient wants information on "Celebrate Recovery")  [place]  [place]  Patient Main Form of Transportation: Set designerCar  Patient Strengths:  helping people, honesty  Patient Identified Areas of Improvement:  better sleep schedule, lose weight  Patient Goal for Hospitalization:  get back on medication and have support  Current SI (including self-harm):  No  Current HI:  No  Current AVH: No  Staff Intervention Plan: Group Attendance, Collaborate with Interdisciplinary Treatment Team  Consent to Intern Participation: N/A  Yvonne Torres 04/24/2018, 2:20 PM

## 2018-04-24 NOTE — Plan of Care (Addendum)
Patient found in common area upon my arrival. Patient is visible and social this evening. Patient mood continues to improve. Reports decrease in S/Sx of WD today. Reports that she is attempting to change her diet in order to control her blood sugar. CBG 276. Patient refused coverage, states, "I want to talk to my doctor before I do that. I don't want to start insulin and then not be able to go without it. I am trying to control it with diet." Denies SI/HI/AVH. Patient remains pleasant and polite. Given Trazodone for sleep with positive results. Compliant with HS medications and staff direction. Q 15 minute checks maintained. Will continue to monitor throughout the shift. Patient slept 7 hours. No apparent distress. Reports S/Sx of WD are much improved. Reports slight nausea, given Zofran. Will monitor for efficacy. Will endorse care to oncoming shift.  Problem: Education: Goal: Knowledge of [place] [facility] Education information/materials will improve Outcome: Progressing   Problem: Education: Goal: Knowledge of disease or condition will improve Outcome: Progressing Goal: Understanding of discharge needs will improve Outcome: Progressing   Problem: Health Behavior/Discharge Planning: Goal: Ability to identify changes in lifestyle to reduce recurrence of condition will improve Outcome: Progressing   Problem: Coping: Goal: Coping ability will improve Outcome: Progressing Goal: Will verbalize feelings Outcome: Progressing   Problem: Education: Goal: Knowledge of the prescribed therapeutic regimen will improve Outcome: Progressing

## 2018-04-24 NOTE — Progress Notes (Signed)
Recreation Therapy Notes  Date: 04/24/2018  Time: 3:00pm  Location: Craft room  Behavioral response: Appropriate  Group Type: Game  Participation level: Active  Communication: Patient was social with peers and staff while participating in group.   Comments: N/A  Alichia Alridge LRT/CTRS        Margherita Collyer 04/24/2018 3:40 PM

## 2018-04-24 NOTE — [facility] Group Notes (Signed)
LCSW Group Therapy Note   04/24/2018  Time: 1PM  Type of Therapy/Topic:  Group Therapy:  Balance in Life  Participation Level:  Did Not Attend  Description of Group:   This group will address the concept of balance and how it feels and looks when one is unbalanced. Patients will be encouraged to process areas in their lives that are out of balance and identify reasons for remaining unbalanced. Facilitators will guide patients in utilizing problem-solving interventions to address and correct the stressor making their life unbalanced. Understanding and applying boundaries will be explored and addressed for obtaining and maintaining a balanced life. Patients will be encouraged to explore ways to assertively make their unbalanced needs known to significant others in their lives, using other group members and facilitator for support and feedback.  Therapeutic Goals: 1. Patient will identify two or more emotions or situations they have that consume much of in their lives. 2. Patient will identify signs/triggers that life has become out of balance:  3. Patient will identify two ways to set boundaries in order to achieve balance in their lives:  4. Patient will demonstrate ability to communicate their needs through discussion and/or role plays  Summary of Patient Progress: Pt was invited to attend group but chose not to attend. CSW will continue to encourage pt to attend group throughout their admission.    Therapeutic Modalities:   Cognitive Behavioral Therapy Solution-Focused Therapy Assertiveness Training  Mysha Peeler, MSW, LCSW Clinical Social Worker 04/24/2018 1:58 PM   

## 2018-04-24 NOTE — Progress Notes (Signed)
Patient refused her morning meal coverage stating to this writer "I'm not on it at home, so I don't want to take it here and not be on it at home". Patient also refused her Metformin because "it makes my liver enzymes go up". MD was notified.

## 2018-04-24 NOTE — B[facility] Group Notes (Signed)
[facility] Group Notes:  (Nursing/MHT/Case Management/Adjunct)  Date:  04/24/2018  Time:  9:49 PM  Type of Therapy:  Group Therapy  Participation Level:  Active  Participation Quality:  Appropriate  Affect:  Appropriate  Cognitive:  Appropriate  Insight:  Appropriate  Engagement in Group:  Engaged  Modes of Intervention:  Discussion  Summary of Progress/Problems: Yvonne Torres participated in group. Yvonne Torres stated her goal was to treat everybody kindly.  Yvonne Torres stated she did a good job. Yvonne Torres stated she had some things that were causing her anxiety at home. Yvonne Torres stated she was able to clear those things up. Yvonne Torres stated, overall having a good day. Yvonne Torres stated she has medical issues that make her not be the nicest person. Yvonne Torres stated she wanted to be more aware of her actions. Yvonne Torres stated it was tough, but she made it through the day. MHT explained rules and expectations of the unit. MHT processed with patients about preparing for discharge. MHT processed with patients about medication compliance. MHT informed patients of outpatient treatment. MHT explained the benefits of outpatient treatment combined with medication compliance. MHT encouraged patients to follow up with treatment recommendations at discharge. Jinger NeighborsKeith D Macarena Langseth 04/24/2018, 9:49 PM

## 2018-04-24 NOTE — BHH Group Notes (Signed)
LCSW Group Therapy Note 04/24/2018 9:00 AM  Type of Therapy and Topic:  Group Therapy:  Setting Goals  Participation Level:  Active  Description of Group: In this process group, patients discussed using strengths to work toward goals and address challenges.  Patients identified two positive things about themselves and one goal they were working on.  Patients were given the opportunity to share openly and support each other's plan for self-empowerment.  The group discussed the value of gratitude and were encouraged to have a daily reflection of positive characteristics or circumstances.  Patients were encouraged to identify a plan to utilize their strengths to work on current challenges and goals.  Therapeutic Goals 1. Patient will verbalize personal strengths/positive qualities and relate how these can assist with achieving desired personal goals 2. Patients will verbalize affirmation of peers plans for personal change and goal setting 3. Patients will explore the value of gratitude and positive focus as related to successful achievement of goals 4. Patients will verbalize a plan for regular reinforcement of personal positive qualities and circumstances.  Summary of Patient Progress: Yvonne Torres was able to actively participate in today's group discussion on setting goals using the SMART Model.  Yvonne Torres shared that she had a good understanding of everything except relevance and time-bound.  Yvonne Torres asked if she is unable to complete a goal by the date that she originally planned does it still remain relevant.  CSW further explained what each concept meant and gave further examples.  Yvonne Torres was able to gain a better understanding with further explanations.  Yvonne Torres shared with the group that she has a goal to get out of her bed each day and move around more.  She shared that she would like to "cooperate" more while here (meant be more involved in the milieu activities.       Therapeutic  Modalities Cognitive Behavioral Therapy Motivational Interviewing    [name], [place] 04/24/2018 11:13 AM

## 2018-04-25 LAB — GLUCOSE, CAPILLARY
GLUCOSE-CAPILLARY: 335 mg/dL — AB (ref 70–99)
Glucose-Capillary: 252 mg/dL — ABNORMAL HIGH (ref 70–99)
Glucose-Capillary: 236 mg/dL — ABNORMAL HIGH (ref 70–99)
Glucose-Capillary: 230 mg/dL — ABNORMAL HIGH (ref 70–99)

## 2018-04-25 MED ORDER — PRAZOSIN HCL 2 MG PO CAPS: 2.0000 mg | ORAL_CAPSULE | Freq: Every day | ORAL | 0 refills | Status: DC

## 2018-04-25 MED ORDER — PRAZOSIN HCL 2 MG PO CAPS: 2.0000 mg | ORAL_CAPSULE | Freq: Every day | ORAL | 1 refills | Status: AC

## 2018-04-25 MED ORDER — NICOTINE 21 MG/24HR TD PT24: 21.0000 mg | MEDICATED_PATCH | Freq: Every day | TRANSDERMAL | 1 refills | Status: AC

## 2018-04-25 MED ORDER — RISPERIDONE 1 MG PO TABS: 1.0000 mg | ORAL_TABLET | Freq: Two times a day (BID) | ORAL | 1 refills | Status: DC

## 2018-04-25 MED ORDER — BLOOD GLUCOSE METER KIT: PACK | 0 refills | Status: AC

## 2018-04-25 MED ORDER — RISPERIDONE 1 MG PO TABS: 1.0000 mg | ORAL_TABLET | Freq: Two times a day (BID) | ORAL | 0 refills | Status: DC

## 2018-04-25 MED ORDER — LINAGLIPTIN 5 MG PO TABS: 5.0000 mg | ORAL_TABLET | Freq: Every day | ORAL | 0 refills | Status: DC

## 2018-04-25 MED ORDER — GABAPENTIN 600 MG PO TABS: 600.0000 mg | ORAL_TABLET | Freq: Three times a day (TID) | ORAL | 1 refills | Status: DC

## 2018-04-25 MED ORDER — HYDROXYZINE HCL 25 MG PO TABS: 25.0000 mg | ORAL_TABLET | Freq: Four times a day (QID) | ORAL | 1 refills | Status: DC | PRN

## 2018-04-25 MED ORDER — LAMOTRIGINE 150 MG PO TABS: 150.0000 mg | ORAL_TABLET | Freq: Every day | ORAL | 1 refills | Status: AC

## 2018-04-25 MED ORDER — LAMOTRIGINE 25 MG PO TABS: 25.0000 mg | ORAL_TABLET | Freq: Every day | ORAL | 0 refills | Status: DC

## 2018-04-25 MED ORDER — HYDROXYZINE HCL 25 MG PO TABS: 25.0000 mg | ORAL_TABLET | Freq: Four times a day (QID) | ORAL | 0 refills | Status: DC | PRN

## 2018-04-25 MED ORDER — GABAPENTIN 600 MG PO TABS: 600.0000 mg | ORAL_TABLET | Freq: Three times a day (TID) | ORAL | 0 refills | Status: DC

## 2018-04-25 MED ORDER — LAMOTRIGINE 100 MG PO TABS: 50.0000 mg | ORAL_TABLET | Freq: Every day | ORAL | 0 refills | Status: DC

## 2018-04-25 MED ORDER — LINAGLIPTIN 5 MG PO TABS: 5.0000 mg | ORAL_TABLET | Freq: Every day | ORAL | 1 refills | Status: AC

## 2018-04-25 NOTE — B[facility] Suicide Risk Assessment (Signed)
[facility]   Principal Problem: Bipolar I disorder, current or most recent episode manic, with psychotic features [facility](HCC) Discharge Diagnoses:  Patient Active Problem List   Diagnosis Date Noted  . Use of nonprescription opiate drugs (HCC) [F11.10] 04/23/2018    Priority: High  . Bipolar I disorder, current or most recent episode manic, with psychotic features (HCC) [F31.2] 04/22/2018    Priority: High  . Alcohol use disorder, moderate, dependence (HCC) [F10.20] 04/22/2018    Priority: High  . PTSD (post-traumatic stress disorder) [F43.10] 04/22/2018    Priority: High  . OCD (obsessive compulsive disorder) [F42.9] 04/22/2018  . Tobacco use disorder [F17.200] 04/22/2018  . Cocaine use disorder, moderate, dependence (HCC) [F14.20] 04/22/2018    Mental Status Per Nursing Assessment::   On Admission:  NA  Demographic Factors:  Caucasian and Unemployed  Loss Factors: NA  Historical Factors: Impulsivity  Risk Reduction Factors:   Living with another person, especially a relative, Positive social support, Positive therapeutic relationship and Positive coping skills or problem solving skills  Continued Clinical Symptoms:  Alcohol/Substance Abuse/Dependencies  Cognitive Features That Contribute To Risk:  None    Suicide Risk:  Minimal: No identifiable suicidal ideation.  Patients presenting with no risk factors but with morbid ruminations; may be classified as minimal risk based on the severity of the depressive symptoms  Follow-up Information    [facility] Go on 04/30/2018.   Why:  Please meet Unk PintoHarvey Bryant (peer support specialist) on Wednesday, 04/30/18 at 7:15AM to begin peer support services. Thank you! Contact information: [address] [place] [address] [phone]            Haskell RilingHolly R Mollee Neer, MD 04/25/2018, 1:01 PM

## 2018-04-25 NOTE — B[facility] Group Notes (Signed)
  04/25/2018  Time: 1PM  Type of Therapy and Topic:  Group Therapy:  Feelings around Relapse and Recovery  Participation Level:  Did Not Attend   Description of Group:    Patients in this group will discuss emotions they experience before and after a relapse. They will process how experiencing these feelings, or avoidance of experiencing them, relates to having a relapse. Facilitator will guide patients to explore emotions they have related to recovery. Patients will be encouraged to process which emotions are more powerful. They will be guided to discuss the emotional reaction significant others in their lives may have to their relapse or recovery. Patients will be assisted in exploring ways to respond to the emotions of others without this contributing to a relapse.  Therapeutic Goals: 1. Patient will identify two or more emotions that lead to a relapse for them 2. Patient will identify two emotions that result when they relapse 3. Patient will identify two emotions related to recovery 4. Patient will demonstrate ability to communicate their needs through discussion and/or role plays   Summary of Patient Progress: Pt was invited to attend group but chose not to attend. CSW will continue to encourage pt to attend group throughout their admission.    Therapeutic Modalities:   Cognitive Behavioral Therapy Solution-Focused Therapy Assertiveness Training Relapse Prevention Therapy  Heidi DachKelsey Elliott Quade, MSW, LCSW Clinical Social Worker 04/25/2018 1:36 PM

## 2018-04-25 NOTE — Discharge Instructions (Signed)
For Lamictal, you will gradually increase to a total daily dose of 150 mg at bedtime. You will take 25 mg for 7 days after discharge (finish the pills that were given to you on discharge). Be sure to fill your scripts you were provided with on discharge. After you are done with the 7 day supply, you will start taking 50 mg at bedtime for 14 days (this will be half of a 100 mg tablet). After 14 days, you will increase to 100 mg at bedtime (take a full tablet of 100 mg tablets). You will do this for 14 days. You will then increase to 150 mg at bedtime and continue this dose (this will be a new prescription)    Fingerstick glucose (sugar) goals for home: Before meals: 80-130 mg/dl 2-Hours after meals: less than 180 mg/dl Hemoglobin A1c goal: 7% or less  You can purchase a blood sugar meter for home use at [facility] over the counter without a prescription: Reli-On prime Meter kit= $9 25 test strips for this meter= $5 Extra Lancets 100 count= $1.56

## 2018-04-25 NOTE — Progress Notes (Signed)
Recreation Therapy Notes  Date: 04/25/2018  Time: 9:30 am  Location: Craft Room  Behavioral response: Appropriate  Intervention Topic: Leisure  Discussion/Intervention:  Group content today was focused on leisure. The group defined what leisure is and some positive leisure activities they participate in. Individuals identified the difference between good and bad leisure. Participants expressed how they feel after participating in the leisure of their choice. The group discussed how they go about picking a leisure activity and if others are involved in their leisure activities. The patient stated how many leisure activities they too choose from and reasons why it is important to have leisure time. Individuals participated in the intervention "Leisure Jeopardy" where they had a chance to identify new leisure activities as well as benefits of leisure. Clinical Observations/Feedback:  Patient came to group late due to unknown reasons. She stated that she has many leisure options to have many outlets. Individual was social with peers and staff during group while participating in the intervention.    Aleicia Kenagy LRT/CTRS         Shneur [place]itten[place] 04/25/2018 12:36 PM

## 2018-04-25 NOTE — Plan of Care (Signed)
D: Patient denies SI/HI/AVH. Patient verbally contracts for safety. Patient is calm, cooperative and pleasant. Patient is seen in milieu interacting with peers. Patient is at times needy and attention seeking. Patient is frequently demanding of peer's attention.   A: Patient was assessed by this nurse. Patient was oriented to unit. Patient's safety was maintained on unit. Q x 15 minute observation checks were completed for safety. Patient care plan was reviewed. Patient was offered support and encouragement. Patient was encourage to attend groups, participate in unit activities and continue with plan of care.    R: Patient has no complaints of pain at this time. Patient is receptive to treatment and safety maintained on unit.     Problem: Education: Goal: Understanding of discharge needs will improve Outcome: Progressing   Problem: Coping: Goal: Will verbalize feelings Outcome: Progressing

## 2018-04-25 NOTE — Progress Notes (Signed)
[facility] MD Progress Note  04/25/2018 12:17 PM Yvonne Torres  MRN:  [id number] Subjective:  Pt states that she is feeling better. Her mood is more stable. She is glad to be back on medications and feels they are working well. She did meet with diabetes coordinator yesterday and got some education. She plans to change her diet to help with blood sugars. She is sleeping well. She is no longer nauseated and w/d symptoms are subsiding. She is out of her room and interacting well with peers. She denies SI or thoughts of self harm. AH are much better. She is tolerating all medications. We went over all her medications together and I made her a chart with exactly when to take her medications. She states that she wants to discharge on Sunday so she can take her grandkids to school on their first day on Monday. This has been a tradition for her for many years.   Principal Problem: Bipolar I disorder, current or most recent episode manic, with psychotic features ([place]) Diagnosis:   Patient Active Problem List   Diagnosis Date Noted  . Use of nonprescription opiate drugs ([place]) [F11.10] 04/23/2018    Priority: High  . Bipolar I disorder, current or most recent episode manic, with psychotic features ([place]) [F31.2] 04/22/2018    Priority: High  . Alcohol use disorder, moderate, dependence ([place]) [F10.20] 04/22/2018    Priority: High  . PTSD (post-traumatic stress disorder) [F43.10] 04/22/2018    Priority: High  . OCD (obsessive compulsive disorder) [F42.9] 04/22/2018  . Tobacco use disorder [F17.200] 04/22/2018  . Cocaine use disorder, moderate, dependence ([place]) [F14.20] 04/22/2018   Total Time spent with patient: 20 minutes  Past Psychiatric History: See H&p  Past Medical History:  Past Medical History:  Diagnosis Date  . Bipolar 1 disorder ([place])   . Diabetes mellitus without complication ([place])   . Opioid dependence in remission [facility]) 2019   Reports she went through residential treatment program  and is clean as of June 2019    Past Surgical History:  Procedure Laterality Date  . ECTOPIC PREGNANCY SURGERY    . TONSILLECTOMY     Family History:  Family History  Problem Relation Age of Onset  . Breast cancer Mother 30   Family Psychiatric  History: See H&P Social History:  Social History   Substance and Sexual Activity  Alcohol Use Yes     Social History   Substance and Sexual Activity  Drug Use Not on file    Social History   Socioeconomic History  . Marital status: Single    Spouse name: Not on file  . Number of children: Not on file  . Years of education: Not on file  . Highest education level: Not on file  Occupational History  . Not on file  Social Needs  . Financial resource strain: Not on file  . Food insecurity:    Worry: Not on file    Inability: Not on file  . Transportation needs:    Medical: Not on file    Non-medical: Not on file  Tobacco Use  . Smoking status: Current Some Day Smoker    Packs/day: 0.25    Types: Cigarettes  . Smokeless tobacco: Never Used  Substance and Sexual Activity  . Alcohol use: Yes  . Drug use: Not on file  . Sexual activity: Not on file  Lifestyle  . Physical activity:    Days per week: Not on file    Minutes per session:  Not on file  . Stress: Not on file  Relationships  . Social connections:    Talks on phone: Not on file    Gets together: Not on file    Attends religious service: Not on file    Active member of club or organization: Not on file    Attends meetings of clubs or organizations: Not on file    Relationship status: Not on file  Other Topics Concern  . Not on file  Social History Narrative  . Not on file   Additional Social History:                         Sleep: Good  Appetite:  Good  Current Medications: Current Facility-Administered Medications  Medication Dose Route Frequency Provider Last Rate Last Dose  . alum & mag hydroxide-simeth (MAALOX/MYLANTA) 200-200-20  MG/5ML suspension 30 mL  30 mL Oral Q4H PRN Pucilowska, Jolanta B, MD      . chlordiazePOXIDE (LIBRIUM) capsule 25 mg  25 mg Oral Q6H PRN Roshawnda Pecora, Tyson Babinski, MD   25 mg at 04/24/18 0505  . chlordiazePOXIDE (LIBRIUM) capsule 25 mg  25 mg Oral BH-qamhs Aldrin Engelhard, Tyson Babinski, MD       Followed by  . [START ON 04/27/2018] chlordiazePOXIDE (LIBRIUM) capsule 25 mg  25 mg Oral Daily Erron Wengert R, MD      . dicyclomine (BENTYL) tablet 20 mg  20 mg Oral Q6H PRN Arianis Bowditch R, MD      . gabapentin (NEURONTIN) tablet 600 mg  600 mg Oral TID Marylin Crosby, MD   600 mg at 04/25/18 1120  . hydrOXYzine (ATARAX/VISTARIL) tablet 25 mg  25 mg Oral Q6H PRN Marylin Crosby, MD   25 mg at 04/24/18 1531  . ibuprofen (ADVIL,MOTRIN) tablet 600 mg  600 mg Oral Q6H PRN Clapacs, Madie Reno, MD   600 mg at 04/23/18 2045  . insulin aspart (novoLOG) injection 0-5 Units  0-5 Units Subcutaneous QHS Tova Vater R, MD      . insulin aspart (novoLOG) injection 0-9 Units  0-9 Units Subcutaneous TID WC Tressa Maldonado, Tyson Babinski, MD   7 Units at 04/25/18 1120  . lamoTRIgine (LAMICTAL) tablet 25 mg  25 mg Oral QHS Pucilowska, Jolanta B, MD   25 mg at 04/24/18 2115  . linagliptin (TRADJENTA) tablet 5 mg  5 mg Oral Daily Ric Rosenberg, Tyson Babinski, MD   5 mg at 04/25/18 0900  . loperamide (IMODIUM) capsule 2-4 mg  2-4 mg Oral PRN Eylin Pontarelli, Tyson Babinski, MD      . magnesium hydroxide (MILK OF MAGNESIA) suspension 30 mL  30 mL Oral Daily PRN Pucilowska, Jolanta B, MD      . methocarbamol (ROBAXIN) tablet 500 mg  500 mg Oral Q8H PRN Audrina Marten, Tyson Babinski, MD   500 mg at 04/24/18 0505  . multivitamin with minerals tablet 1 tablet  1 tablet Oral Daily Ashley Bultema, Tyson Babinski, MD   1 tablet at 04/25/18 0900  . nicotine (NICODERM CQ - dosed in mg/24 hours) patch 21 mg  21 mg Transdermal Daily Pucilowska, Jolanta B, MD   21 mg at 04/25/18 0900  . ondansetron (ZOFRAN-ODT) disintegrating tablet 4 mg  4 mg Oral Q8H PRN Pucilowska, Jolanta B, MD   4 mg at 04/25/18 0605  . pantoprazole (PROTONIX) EC tablet  40 mg  40 mg Oral Daily Pucilowska, Jolanta B, MD   40 mg at 04/25/18 0900  . prazosin (MINIPRESS) capsule  2 mg  2 mg Oral QHS Zaion Hreha, Tyson Babinski, MD   2 mg at 04/24/18 2115  . risperiDONE (RISPERDAL) tablet 1 mg  1 mg Oral BID Jazzalyn Loewenstein, Tyson Babinski, MD   1 mg at 04/25/18 0900  . thiamine (VITAMIN B-1) tablet 100 mg  100 mg Oral Daily Aedan Geimer, Tyson Babinski, MD   100 mg at 04/25/18 0900  . traZODone (DESYREL) tablet 100 mg  100 mg Oral QHS PRN Pucilowska, Jolanta B, MD   100 mg at 04/24/18 2115    Lab Results:  Results for orders placed or performed during the hospital encounter of 04/22/18 (from the past 48 hour(s))  Glucose, capillary     Status: Abnormal   Collection Time: 04/24/18  8:46 AM  Result Value Ref Range   Glucose-Capillary 300 (H) 70 - 99 mg/dL  Glucose, capillary     Status: Abnormal   Collection Time: 04/24/18 11:38 AM  Result Value Ref Range   Glucose-Capillary 230 (H) 70 - 99 mg/dL  Glucose, capillary     Status: Abnormal   Collection Time: 04/24/18  5:26 PM  Result Value Ref Range   Glucose-Capillary 272 (H) 70 - 99 mg/dL  Glucose, capillary     Status: Abnormal   Collection Time: 04/24/18  8:27 PM  Result Value Ref Range   Glucose-Capillary 276 (H) 70 - 99 mg/dL  Glucose, capillary     Status: Abnormal   Collection Time: 04/25/18  6:47 AM  Result Value Ref Range   Glucose-Capillary 252 (H) 70 - 99 mg/dL   Comment 1 Notify RN   Glucose, capillary     Status: Abnormal   Collection Time: 04/25/18 11:17 AM  Result Value Ref Range   Glucose-Capillary 335 (H) 70 - 99 mg/dL   Comment 1 Notify RN     Blood Alcohol level:  Lab Results  Component Value Date   ETH 13 (H) 22/97/9892    Metabolic Disorder Labs: Lab Results  Component Value Date   HGBA1C 8.1 (H) 04/23/2018   MPG 185.77 04/23/2018   No results found for: PROLACTIN Lab Results  Component Value Date   CHOL 187 04/23/2018   TRIG 332 (H) 04/23/2018   HDL 42 04/23/2018   CHOLHDL 4.5 04/23/2018   VLDL 66 (H)  04/23/2018   LDLCALC 79 04/23/2018    Physical Findings: AIMS:  , ,  ,  ,    CIWA:  CIWA-Ar Total: 1 COWS:     Musculoskeletal: Strength & Muscle Tone: within normal limits Gait & Station: normal Patient leans: N/A  Psychiatric Specialty Exam: Physical Exam  Nursing note and vitals reviewed.   Review of Systems  All other systems reviewed and are negative.   Blood pressure (!) 147/79, pulse (!) 101, temperature 98.6 F (37 C), temperature source Oral, resp. rate 18, height 5' 3"  (1.6 m), weight 99.8 kg, last menstrual period 04/05/2018, SpO2 97 %.Body mass index is 38.97 kg/m.  General Appearance: Casual  Eye Contact:  Good  Speech:  Clear and Coherent  Volume:  Normal  Mood:  Euthymic  Affect:  Congruent  Thought Process:  Coherent and Goal Directed  Orientation:  Full (Time, Place, and Person)  Thought Content:  Logical  Suicidal Thoughts:  No  Homicidal Thoughts:  No  Memory:  Immediate;   Fair  Judgement:  Fair  Insight:  Fair  Psychomotor Activity:  Normal  Concentration:  Concentration: Fair  Recall:  [employer] of Knowledge:  Fair  Language:  Fair  Akathisia:  No      Assets:  Resilience  ADL's:  Intact  Cognition:  WNL  Sleep:  Number of Hours: 7     Treatment Plan Summary: 47 yo female admitted due to [facility] and mood instability. She is feeling better and mood is more stable. AH are resolving and medications are helpful.   Plan:  Mood disorder -Continue Risperdal 1 mg BID -Continue Lamictal 25 mg daily  PTSD nightmares -Continue Prazosin 2 mg qhs  Alcohol use disorder -Continue Gabapentin 600 mg TID -She will complete Librium taper on Sunday  Opiate use disorder -Continue Supportive measures  Elevated LFTs Likely from alcohol use  DM -Continue Tradjenta 5 mg daily. She met with Diabetes RN who provided diabetes education.   Dispo -She will likely discharge Sunday and follow up with [facility]  Marylin Crosby, MD 04/25/2018, 12:17 PM

## 2018-04-26 LAB — GLUCOSE, CAPILLARY
Glucose-Capillary: 193 mg/dL — ABNORMAL HIGH (ref 70–99)
Glucose-Capillary: 287 mg/dL — ABNORMAL HIGH (ref 70–99)
Glucose-Capillary: 239 mg/dL — ABNORMAL HIGH (ref 70–99)
Glucose-Capillary: 213 mg/dL — ABNORMAL HIGH (ref 70–99)

## 2018-04-26 MED ORDER — GABAPENTIN 400 MG PO CAPS
800.0000 mg | ORAL_CAPSULE | Freq: Three times a day (TID) | ORAL | Status: DC
  Administered 2018-04-26 – 2018-04-27 (×2): 800 mg via ORAL
  Filled 2018-04-26 (×2): qty 2

## 2018-04-26 NOTE — Progress Notes (Signed)
D: Patient is alert and oriented x 4, no distress noted, affect is blunted, mood is labile, she denies SI/HI/AVH., and is pleasant and cooperative with treatment plans. Patient   appears less anxious and she is interacting with peers and staff appropriately. Patient was visible in the milieu no distress noted, she also had visitors visit her this evening.  A: Pt was offered support and encouragement, she was given scheduled medications, and was encouraged to attend groups. Q 15 minute checks were done for safety.  R:Pt attends group and interacts well with peers and staff. Pt is taking medication and receptive to treatment and safety maintained on unit.

## 2018-04-26 NOTE — BHH Group Notes (Signed)
LCSW Group Therapy Note  04/26/2018 1:15pm  Type of Therapy and Topic:  Group Therapy:  Healthy Self Image and Positive Change  Participation Level:  Active   Description of Group:  In this group, patients will compare and contrast their current "I am...." statements to the visions they identify as desirable for their lives.  Patients discuss fears and how they can make positive changes in their cognitions that will positively impact their behaviors.  Facilitator played a motivational 3-minute speech and patients were left with the task of thinking about what "I am...." statements they can start using in their lives immediately.  Therapeutic Goals: 1. Patient will state their current self-perception as expressed in an "I Am" statement 2. Patient will contrast this with their desired vision for their live 3. Patient will identify 3 fears that negatively impact their behavior 4. Patient will discuss cognitive distortions that stem from their fears 5. Patient will verbalize statements that challenge their cognitive distortions  Summary of Patient Progress:  The patient reported that she feels "amazing." The patient stated, "I am loyal to myself and others" Patient discussed her fears and how she can make positive changes in her cognitions that will positively impact her behaviors. Patient was able to discuss and process cognitive distortions that stem from her fears. Patient actively and appropriately engaged in the group. Patient was able to provide support and validation to other group members. Patient practiced active listening when interacting with the facilitator and other group members.    Therapeutic Modalities Cognitive Behavioral Therapy Motivational Interviewing  Lailee Hoelzel  CUEBAS-COLON, LCSW 04/26/2018 12:14 PM

## 2018-04-26 NOTE — BHH Group Notes (Signed)
[facility] Group Notes:  (Nursing/MHT/Case Management/Adjunct)  Date:  04/26/2018  Time:  11:58 PM  Type of Therapy:  Group Therapy  Participation Level:  Active  Participation Quality:  Appropriate  Affect:  Appropriate  Cognitive:  Appropriate  Insight:  Appropriate  Engagement in Group:  Engaged  Modes of Intervention:  Discussion  Summary of Progress/Problems: Yvonne Torres stated her goal was to get her medications and after care in order for her discharge. Yvonne Torres stated she wanted to change the way she interacts with her support group. Yvonne Torres stated she is trying to say things in a better manner instead of snapping at people. Jinger NeighborsKeith D Catilyn Boggus 04/26/2018, 11:58 PM

## 2018-04-26 NOTE — Progress Notes (Signed)
[facility] MD Progress Note  04/26/2018 2:45 PM Yvonne Torres  MRN:  [id number] Subjective:   Leg crams" Pt reports feeling better, denies Si/Hi, wanting to go home tomorrow so that she can take her grandkids to school on their first day on Monday. requesting prn zofran and a letter upon d/c, agrees to increase neurontin , states she was on 800mg  tid in the past.   Principal Problem: Bipolar I disorder, current or most recent episode manic, with psychotic features (HCC) Diagnosis:   Patient Active Problem List   Diagnosis Date Noted  . Use of nonprescription opiate drugs (HCC) [F11.10] 04/23/2018  . Bipolar I disorder, current or most recent episode manic, with psychotic features (HCC) [F31.2] 04/22/2018  . Alcohol use disorder, moderate, dependence (HCC) [F10.20] 04/22/2018  . PTSD (post-traumatic stress disorder) [F43.10] 04/22/2018  . OCD (obsessive compulsive disorder) [F42.9] 04/22/2018  . Tobacco use disorder [F17.200] 04/22/2018  . Cocaine use disorder, moderate, dependence (HCC) [F14.20] 04/22/2018   Total Time spent with patient:25 min  Past Psychiatric History: See H&p  Past Medical History:  Past Medical History:  Diagnosis Date  . Bipolar 1 disorder (HCC)   . Diabetes mellitus without complication (HCC)   . Opioid dependence in remission [facility]) 2019   Reports she went through residential treatment program and is clean as of June 2019    Past Surgical History:  Procedure Laterality Date  . ECTOPIC PREGNANCY SURGERY    . TONSILLECTOMY     Family History:  Family History  Problem Relation Age of Onset  . Breast cancer Mother 42   Family Psychiatric  History: See H&P Social History:  Social History   Substance and Sexual Activity  Alcohol Use Yes     Social History   Substance and Sexual Activity  Drug Use Not on file    Social History   Socioeconomic History  . Marital status: Single    Spouse name: Not on file  . Number of children: Not on file   . Years of education: Not on file  . Highest education level: Not on file  Occupational History  . Not on file  Social Needs  . Financial resource strain: Not on file  . Food insecurity:    Worry: Not on file    Inability: Not on file  . Transportation needs:    Medical: Not on file    Non-medical: Not on file  Tobacco Use  . Smoking status: Current Some Day Smoker    Packs/day: 0.25    Types: Cigarettes  . Smokeless tobacco: Never Used  Substance and Sexual Activity  . Alcohol use: Yes  . Drug use: Not on file  . Sexual activity: Not on file  Lifestyle  . Physical activity:    Days per week: Not on file    Minutes per session: Not on file  . Stress: Not on file  Relationships  . Social connections:    Talks on phone: Not on file    Gets together: Not on file    Attends religious service: Not on file    Active member of club or organization: Not on file    Attends meetings of clubs or organizations: Not on file    Relationship status: Not on file  Other Topics Concern  . Not on file  Social History Narrative  . Not on file   Additional Social History:  Sleep: Good  Appetite:  Good  Current Medications: Current Facility-Administered Medications  Medication Dose Route Frequency Provider Last Rate Last Dose  . alum & mag hydroxide-simeth (MAALOX/MYLANTA) 200-200-20 MG/5ML suspension 30 mL  30 mL Oral Q4H PRN Pucilowska, Jolanta B, MD      . Melene Muller ON 04/27/2018] chlordiazePOXIDE (LIBRIUM) capsule 25 mg  25 mg Oral Daily McNew, Holly R, MD      . dicyclomine (BENTYL) tablet 20 mg  20 mg Oral Q6H PRN McNew, Ileene Hutchinson, MD      . gabapentin (NEURONTIN) capsule 800 mg  800 mg Oral TID Beverly Sessions, MD      . ibuprofen (ADVIL,MOTRIN) tablet 600 mg  600 mg Oral Q6H PRN Clapacs, Jackquline Denmark, MD   600 mg at 04/26/18 4098  . insulin aspart (novoLOG) injection 0-5 Units  0-5 Units Subcutaneous QHS Haskell Riling, MD   2 Units at 04/25/18 2228   . insulin aspart (novoLOG) injection 0-9 Units  0-9 Units Subcutaneous TID WC McNew, Ileene Hutchinson, MD   5 Units at 04/26/18 1201  . lamoTRIgine (LAMICTAL) tablet 25 mg  25 mg Oral QHS Pucilowska, Jolanta B, MD   25 mg at 04/25/18 2229  . linagliptin (TRADJENTA) tablet 5 mg  5 mg Oral Daily McNew, Ileene Hutchinson, MD   5 mg at 04/26/18 0830  . magnesium hydroxide (MILK OF MAGNESIA) suspension 30 mL  30 mL Oral Daily PRN Pucilowska, Jolanta B, MD      . methocarbamol (ROBAXIN) tablet 500 mg  500 mg Oral Q8H PRN McNew, Ileene Hutchinson, MD   500 mg at 04/24/18 0505  . multivitamin with minerals tablet 1 tablet  1 tablet Oral Daily McNew, Ileene Hutchinson, MD   1 tablet at 04/26/18 0846  . nicotine (NICODERM CQ - dosed in mg/24 hours) patch 21 mg  21 mg Transdermal Daily Pucilowska, Jolanta B, MD   21 mg at 04/26/18 0848  . ondansetron (ZOFRAN-ODT) disintegrating tablet 4 mg  4 mg Oral Q8H PRN Pucilowska, Jolanta B, MD   4 mg at 04/26/18 0635  . pantoprazole (PROTONIX) EC tablet 40 mg  40 mg Oral Daily Pucilowska, Jolanta B, MD   40 mg at 04/26/18 0830  . prazosin (MINIPRESS) capsule 2 mg  2 mg Oral QHS McNew, Ileene Hutchinson, MD   2 mg at 04/25/18 2229  . risperiDONE (RISPERDAL) tablet 1 mg  1 mg Oral BID McNew, Ileene Hutchinson, MD   1 mg at 04/26/18 0831  . thiamine (VITAMIN B-1) tablet 100 mg  100 mg Oral Daily McNew, Ileene Hutchinson, MD   100 mg at 04/26/18 0831  . traZODone (DESYREL) tablet 100 mg  100 mg Oral QHS PRN Pucilowska, Jolanta B, MD   100 mg at 04/25/18 2229    Lab Results:  Results for orders placed or performed during the hospital encounter of 04/22/18 (from the past 48 hour(s))  Glucose, capillary     Status: Abnormal   Collection Time: 04/24/18  5:26 PM  Result Value Ref Range   Glucose-Capillary 272 (H) 70 - 99 mg/dL  Glucose, capillary     Status: Abnormal   Collection Time: 04/24/18  8:27 PM  Result Value Ref Range   Glucose-Capillary 276 (H) 70 - 99 mg/dL  Glucose, capillary     Status: Abnormal   Collection Time: 04/25/18   6:47 AM  Result Value Ref Range   Glucose-Capillary 252 (H) 70 - 99 mg/dL   Comment 1 Notify RN  Glucose, capillary     Status: Abnormal   Collection Time: 04/25/18 11:17 AM  Result Value Ref Range   Glucose-Capillary 335 (H) 70 - 99 mg/dL   Comment 1 Notify RN   Glucose, capillary     Status: Abnormal   Collection Time: 04/25/18  4:36 PM  Result Value Ref Range   Glucose-Capillary 236 (H) 70 - 99 mg/dL   Comment 1 Notify RN   Glucose, capillary     Status: Abnormal   Collection Time: 04/25/18  9:33 PM  Result Value Ref Range   Glucose-Capillary 230 (H) 70 - 99 mg/dL  Glucose, capillary     Status: Abnormal   Collection Time: 04/26/18  7:10 AM  Result Value Ref Range   Glucose-Capillary 193 (H) 70 - 99 mg/dL   Comment 1 Notify RN   Glucose, capillary     Status: Abnormal   Collection Time: 04/26/18 11:13 AM  Result Value Ref Range   Glucose-Capillary 287 (H) 70 - 99 mg/dL    Blood Alcohol level:  Lab Results  Component Value Date   ETH 13 (H) 04/21/2018    Metabolic Disorder Labs: Lab Results  Component Value Date   HGBA1C 8.1 (H) 04/23/2018   MPG 185.77 04/23/2018   No results found for: PROLACTIN Lab Results  Component Value Date   CHOL 187 04/23/2018   TRIG 332 (H) 04/23/2018   HDL 42 04/23/2018   CHOLHDL 4.5 04/23/2018   VLDL 66 (H) 04/23/2018   LDLCALC 79 04/23/2018    Physical Findings: AIMS:  , ,  ,  ,    CIWA:  CIWA-Ar Total: 0 COWS:     Musculoskeletal: Strength & Muscle Tone: within normal limits Gait & Station: normal Patient leans: N/A  Psychiatric Specialty Exam: Physical Exam  Nursing note and vitals reviewed.   Review of Systems  All other systems reviewed and are negative.   Blood pressure 133/87, pulse (!) 106, temperature 97.8 F (36.6 C), temperature source Oral, resp. rate 16, height 5\' 3"  (1.6 m), weight 99.8 kg, last menstrual period 04/05/2018, SpO2 97 %.Body mass index is 38.97 kg/m.  General Appearance: Casual  Eye  Contact:  Good  Speech:  Clear and Coherent  Volume:  Normal  Mood:  Euthymic  Affect:  Congruent  Thought Process:  Coherent and Goal Directed  Orientation:  Full (Time, Place, and Person)  Thought Content:  Logical, denies Si/HI  Suicidal Thoughts:  No  Homicidal Thoughts:  No  Memory:  Immediate;   Fair  Judgement:  Fair  Insight:  Fair  Psychomotor Activity:  Normal  Concentration:  Concentration: Fair  Recall:  [employer]Fair  Fund of Knowledge:  Fair  Language:  Fair  Akathisia:  No      Assets:  Resilience  ADL's:  Intact  Cognition:  WNL  Sleep:  Number of Hours: 5.75     Treatment Plan Summary: 47 [facility] She is feeling better and mood is more stable. AH are resolving and medications are helpful.   Plan:  Mood disorder -Continue Risperdal 1 mg BID -Continue Lamictal 25 mg daily  PTSD nightmares -Continue Prazosin 2 mg qhs  Alcohol use disorder -increase Gabapentin to 800 mg TID for anxiety, leg crams, AUD -She will complete Librium taper on Sunday  Opiate use disorder  -Continue Supportive measures  Dispo -She will likely discharge Sunday and follow up with [facility]  Beverly SessionsJagannath Aretta Stetzel, MD 04/26/2018, 2:45 PMPatient ID: Yvonne SetaHeather  Louann SjogrenAnn Nicholes, female   DOB: [birth year], 47 y.o.   MRN: [id number]

## 2018-04-26 NOTE — Plan of Care (Signed)
Patient is alert and oriented x 4, denies SI, HI and AVH. Patient anxious about being discharged tomorrow. Patient asking for a doctors noted for hospital stay. Patient goes to group and hypervigilant. Problem: Education: Goal: Knowledge of  General Education information/materials will improve Outcome: Progressing   Problem: Health Behavior/Discharge Planning: Goal: Ability to identify changes in lifestyle to reduce recurrence of condition will improve Outcome: Progressing Goal: Identification of resources available to assist in meeting health care needs will improve Outcome: Progressing

## 2018-04-26 NOTE — Plan of Care (Signed)
  Problem: Coping: Goal: Coping ability will improve Outcome: Progressing  Patient is coping appropriately, interacting with peers, and she appears less anxious

## 2018-04-27 LAB — GLUCOSE, CAPILLARY: Glucose-Capillary: 208 mg/dL — ABNORMAL HIGH (ref 70–99)

## 2018-04-27 MED ORDER — GABAPENTIN 800 MG PO TABS: 800.0000 mg | ORAL_TABLET | Freq: Three times a day (TID) | ORAL | 0 refills | Status: AC

## 2018-04-27 NOTE — Plan of Care (Signed)
Patient is excited about going home today and has worries about how to get her prescription medications filled for lack of income, encouraged patient visit her community resource center for help, patient acknowledged and noted, denies any SI/HI and no signs of AVH 15 minute rounding maintained no distress.   Problem: Education: Goal: Knowledge of [place] [facility] Education information/materials will improve Outcome: Progressing   Problem: Education: Goal: Knowledge of disease or condition will improve Outcome: Progressing Goal: Understanding of discharge needs will improve Outcome: Progressing   Problem: Health Behavior/Discharge Planning: Goal: Ability to identify changes in lifestyle to reduce recurrence of condition will improve Outcome: Progressing Goal: Identification of resources available to assist in meeting health care needs will improve Outcome: Progressing   Problem: Coping: Goal: Coping ability will improve Outcome: Progressing Goal: Will verbalize feelings Outcome: Progressing   Problem: Education: Goal: Knowledge of the prescribed therapeutic regimen will improve Outcome: Progressing   Problem: Education: Goal: Knowledge of General Education information will improve Description Including pain rating scale, medication(s)/side effects and non-pharmacologic comfort measures Outcome: Progressing

## 2018-04-27 NOTE — Progress Notes (Signed)
  [facility] :  Will you be returning to the same living situation after discharge:  Yes,  pt will be going back to her home At discharge, do you have transportation home?: Yes,  pt has her own car outside the hospital Do you have the ability to pay for your medications: No.  Release of information consent forms completed and in the chart;  Patient's signature needed at discharge.  Patient to Follow up at: Follow-up Information    [facility] Go on 04/30/2018.   Why:  Please meet Unk PintoHarvey Bryant (peer support specialist) on Wednesday, 04/30/18 at 7:15AM to begin peer support services. Thank you! Contact information: [address] [place] [id number] [phone]           Next level of care provider [facility]:yes  Safety Planning and Suicide Prevention discussed: Yes,  CSW discussed with pt   Have you used any form of tobacco in the last 30 days? (Cigarettes, Smokeless Tobacco, Cigars, and/or Pipes): Yes  Has patient been referred to the Quitline?: Patient refused referral  Patient has been referred for addiction treatment: Yes  Camala Talwar  CUEBAS-COLON, LCSW 04/27/2018, 9:54 AM

## 2018-04-27 NOTE — ED Notes (Signed)
First Nurse Note: Patient ambulatory to STAT registration on this date requesting "proof that she was here" on this date.  Given copy of discharge instructions from 04/05/18 visit and expresses satisfaction with this.
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
## 2018-04-27 NOTE — [facility] Suicide Risk Assessment (Signed)
[facility]   Principal Problem: Bipolar I disorder, current or most recent episode manic, with psychotic features [facility](HCC) Discharge Diagnoses:  Patient Active Problem List   Diagnosis Date Noted  . Use of nonprescription opiate drugs ([facility]) [F11.10] 04/23/2018  . Bipolar I disorder, current or most recent episode manic, with psychotic features ([facility]) [F31.2] 04/22/2018  . Alcohol use disorder, moderate, dependence ([facility]) [F10.20] 04/22/2018  . PTSD (post-traumatic stress disorder) [F43.10] 04/22/2018  . OCD (obsessive compulsive disorder) [F42.9] 04/22/2018  . Tobacco use disorder [F17.200] 04/22/2018  . Cocaine use disorder, moderate, dependence ([facility]) [F14.20] 04/22/2018    Total Time spent with patient: 25 min  Musculoskeletal: Strength & Muscle Tone: within normal limits Gait & Station: normal Patient leans:   Psychiatric Specialty Exam: ROS  Blood pressure 133/80, pulse (!) 117, temperature 98.2 F (36.8 C), temperature source Oral, resp. rate 18, height 5\' 3"  (1.6 m), weight 99.8 kg, last menstrual period 04/05/2018, SpO2 96 %.Body mass index is 38.97 kg/m.  General Appearance: Casual  Eye Contact:  Good  Speech:  Clear and Coherent  Volume:  Normal  Mood:  Euthymic  Affect:  Congruent  Thought Process:  Coherent and Goal Directed  Orientation:  Full (Time, Place, and Person)  Thought Content:  Logical, denies Si/HI  Suicidal Thoughts:  No  Homicidal Thoughts:  No  Memory:  Immediate;   Fair  Judgement:  Fair  Insight:  Fair  Psychomotor Activity:  Normal  Concentration:  Concentration: Fair  Recall:  [employer]  Fund of Knowledge:  Fair  Language:  Fair  Akathisia:  No      Assets:  Resilience  ADL's:  Intact  Cognition:  WNL  Sleep:  Number of Hours: 5.75     Mental Status Per Nursing Assessment::   On Admission:  NA  Demographic Factors:  Caucasian  Loss Factors:   Historical Factors: Prior suicide attempts- yes, 1 suicide  attempt when she was 22 by overdosing on Xanax  Risk Reduction Factors:   Responsible for children under 45 years of age and Sense of responsibility to family  Continued Clinical Symptoms:  Alcohol/Substance Abuse/Dependencies  Cognitive Features That Contribute To Risk:  None    Suicide Risk:  mild  Follow-up Information    [facility] Go on 04/30/2018.   Why:  Please meet Unk PintoHarvey Bryant (peer support specialist) on Wednesday, 04/30/18 at 7:15AM to begin peer support services. Thank you! Contact information: [address] [place] [address] [phone]           Plan Of Care/Follow-up recommendations:  As scheduled  Beverly SessionsJagannath Katrece Roediger, MD 04/27/2018, 9:40 AM

## 2018-06-09 ENCOUNTER — Ambulatory Visit: Payer: Self-pay | Admitting: Pharmacy Technician

## 2018-06-11 NOTE — Progress Notes (Signed)
  Completed [facility] application and contract.  Patient still needs to sign [facility] contract.    Patient approved to receive medication assistance at [facility] as long as eligibility criteria continues to be met.    [facility]

## 2018-06-23 DIAGNOSIS — F419 Anxiety disorder, unspecified: Secondary | ICD-10-CM | POA: Insufficient documentation

## 2018-06-23 DIAGNOSIS — F32A Depression, unspecified: Secondary | ICD-10-CM | POA: Insufficient documentation

## 2018-06-23 DIAGNOSIS — F329 Major depressive disorder, single episode, unspecified: Secondary | ICD-10-CM | POA: Insufficient documentation

## 2019-02-27 LAB — HM PAP SMEAR: HM Pap smear: NEGATIVE

## 2019-02-27 LAB — HM HIV SCREENING LAB: HM HIV Screening: NEGATIVE

## 2019-02-27 LAB — HM HEPATITIS C SCREENING LAB: HM Hepatitis Screen: NEGATIVE

## 2019-03-23 ENCOUNTER — Encounter: Payer: Self-pay | Admitting: Physician Assistant

## 2019-04-22 ENCOUNTER — Telehealth: Payer: Self-pay | Admitting: Pharmacy Technician

## 2019-04-22 NOTE — Telephone Encounter (Signed)
Patient failed to provide2020 financial documentation. No additional medication assistance will be provided by [facility] without the required proof of income documentation. Patient notified by letter.  Maybel Dambrosio, CPhT [facility] 

## 2019-05-11 IMAGING — CT CT HEAD W/O CM
3 series · 15 of 46 positions shown, 18 images · non-contrast
Comparison: None.

CLINICAL DATA: Blurred vision, slurred speech

EXAM:
CT HEAD WITHOUT CONTRAST
TECHNIQUE: Contiguous axial images were obtained from the base of the skull
through the vertex without intravenous contrast.

[Series 2: head wo · axial · 0.47mm/px · z∈[-125,-5]mm · 9 of 29 slices shown, 12 images]
[im 3/29  brain]
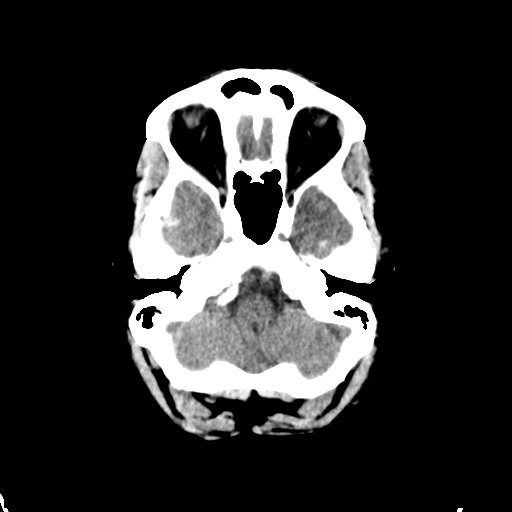
[im 3/29  bone]
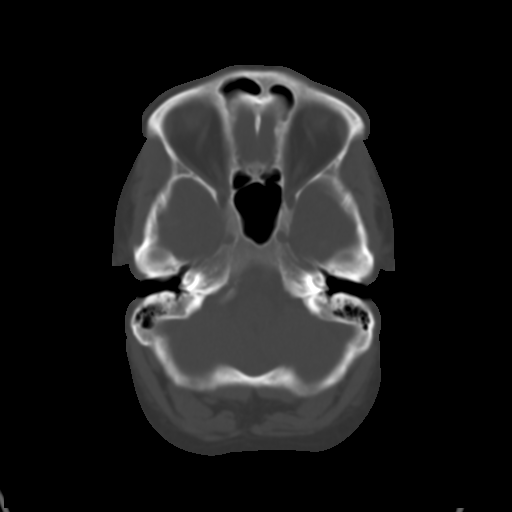
[im 6/29  brain]
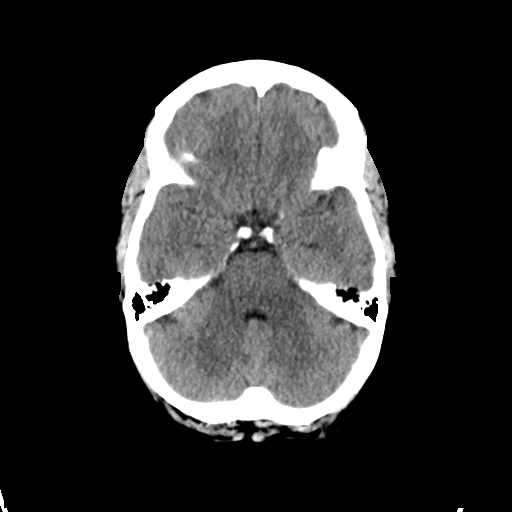
[im 9/29  brain]
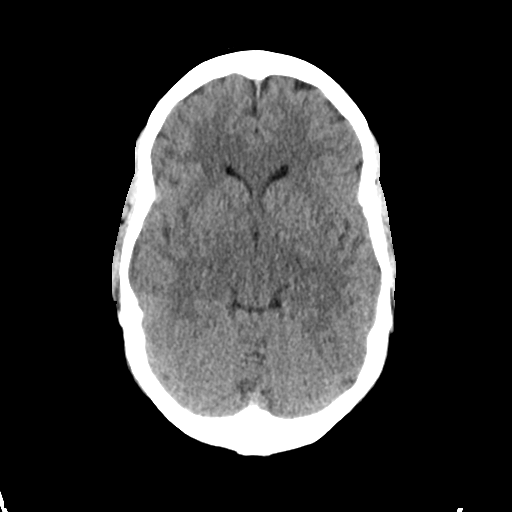
[im 12/29  brain]
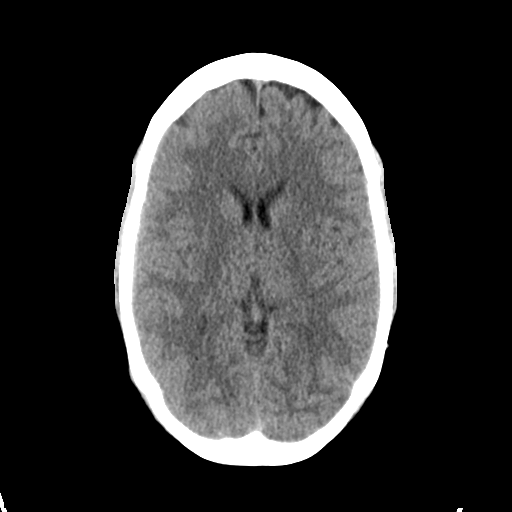
[im 15/29  brain]
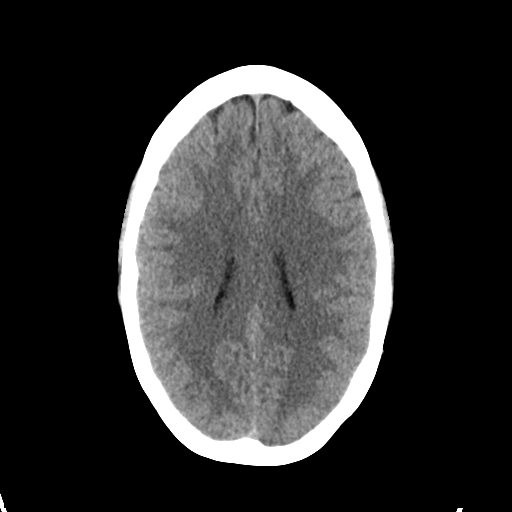
[im 15/29  bone]
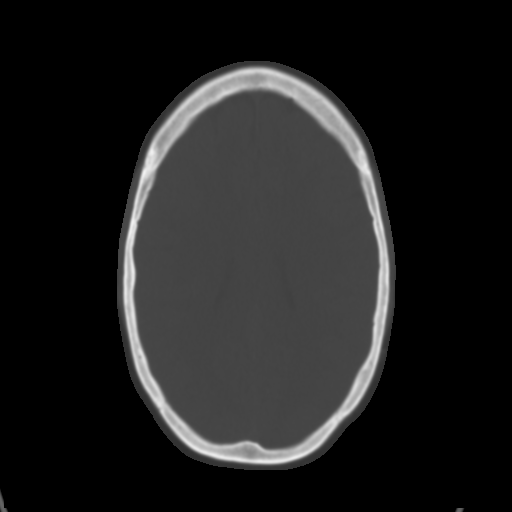
[im 18/29  brain]
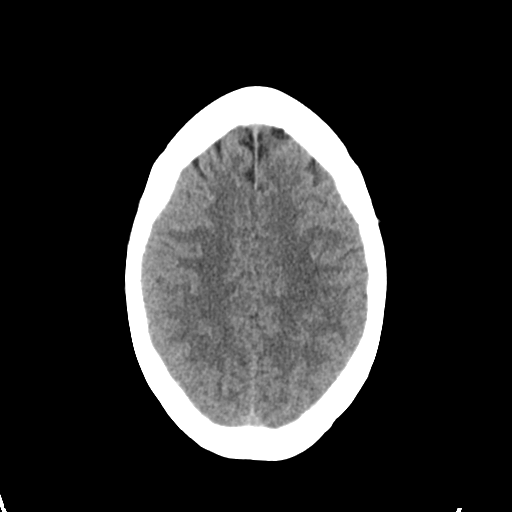
[im 21/29  brain]
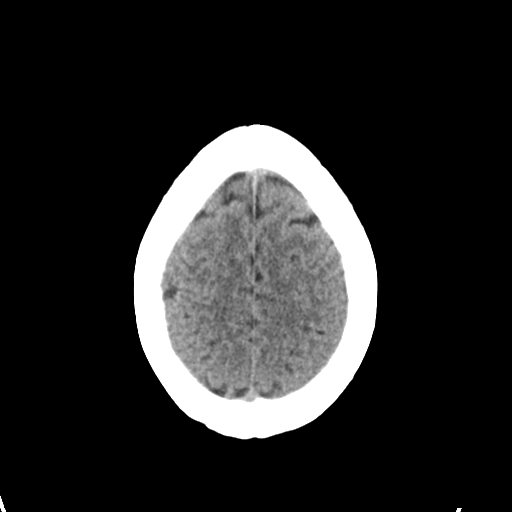
[im 24/29  brain]
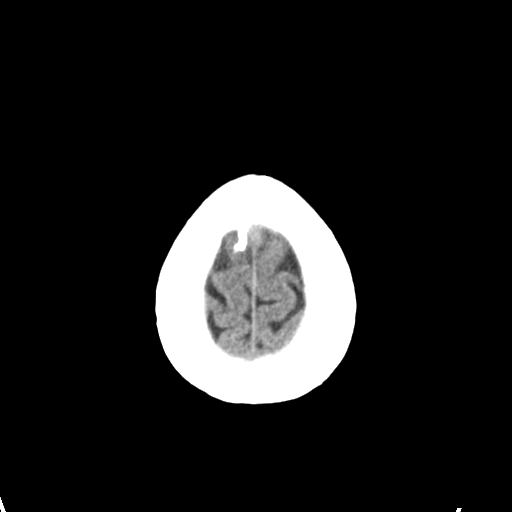
[im 27/29  brain]
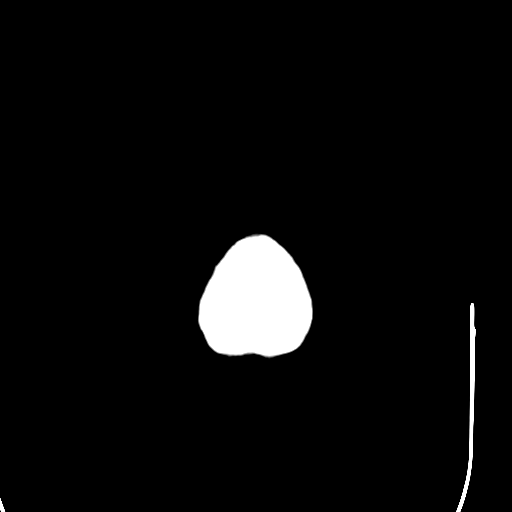
[im 27/29  bone]
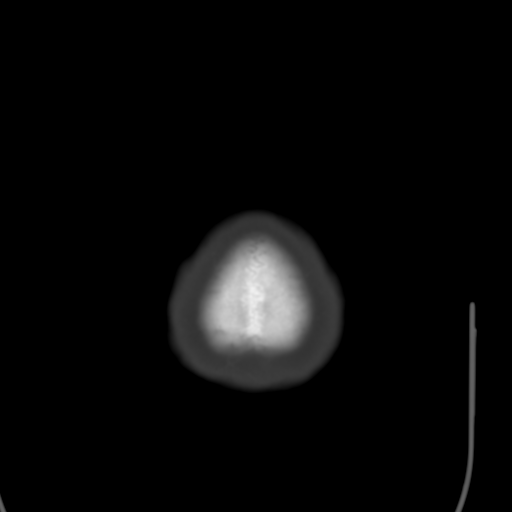

[Series 4: coronal soft tissue · coronal · 0.28mm/px · 3 of 70 slices shown]
[im 24/70  brain]
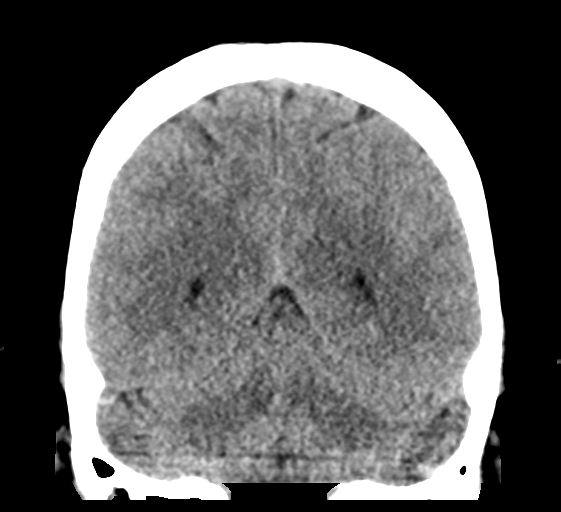
[im 31/70  brain]
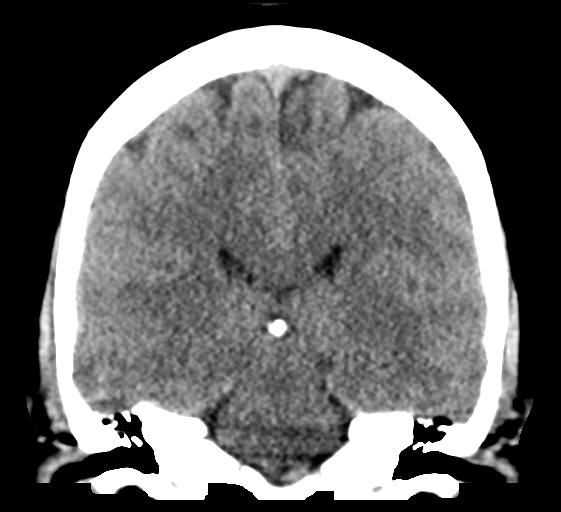
[im 39/70  brain]
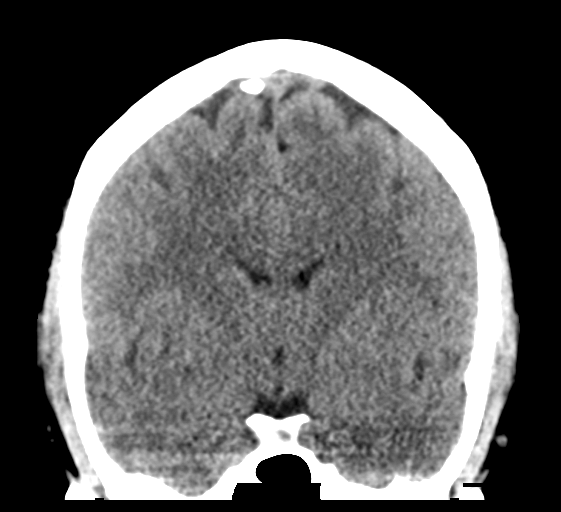

[Series 5: sagittal soft tissue · sagittal · 0.28mm/px · 3 of 54 slices shown]
[im 18/54  brain]
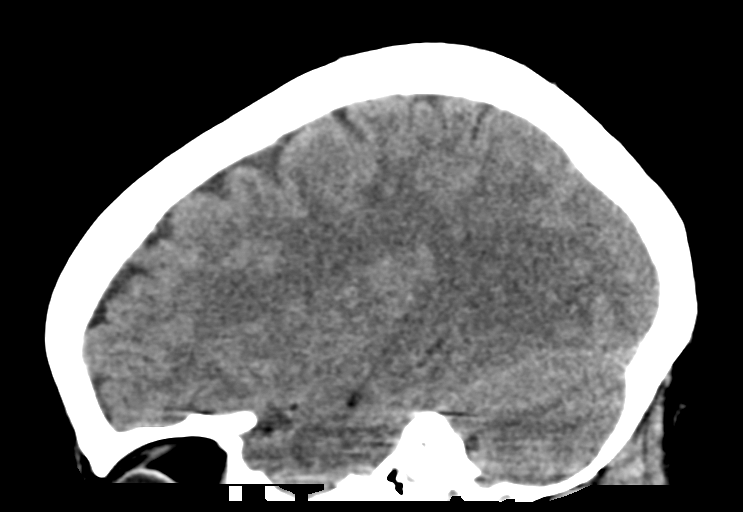
[im 27/54  brain]
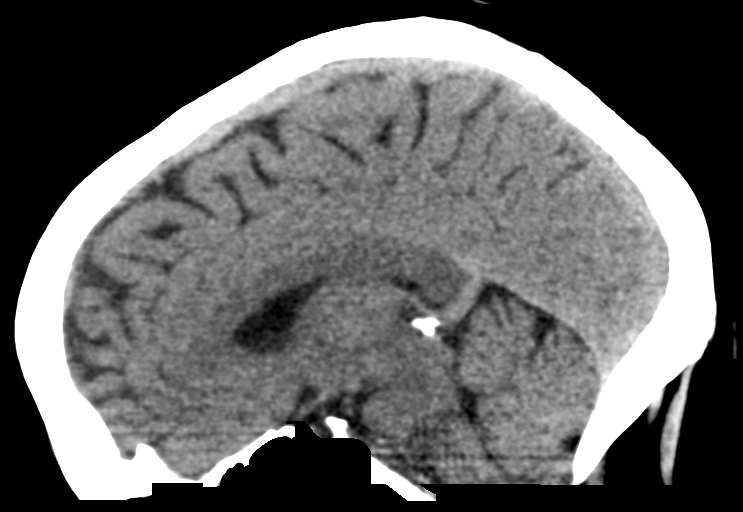
[im 36/54  brain]
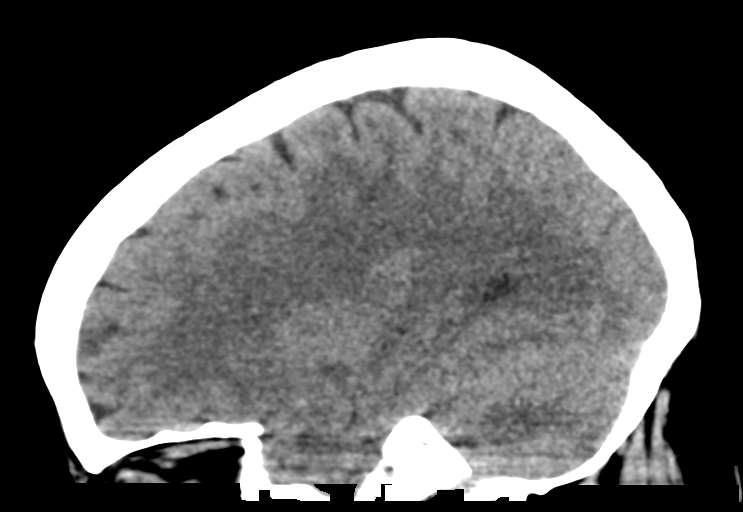

[15 of 46 positions shown; findings below may reference images not displayed]

FINDINGS: Brain: No acute intracranial abnormality. Specifically, no
hemorrhage, hydrocephalus, mass lesion, acute infarction, or
significant intracranial injury.

Vascular: No hyperdense vessel or unexpected calcification.

Skull: No acute calvarial abnormality.

Sinuses/Orbits: Visualized paranasal sinuses and mastoids clear.
Orbital soft tissues unremarkable.

Other: None
IMPRESSION: Normal study.

## 2019-06-09 ENCOUNTER — Emergency Department: Payer: Self-pay

## 2019-06-09 ENCOUNTER — Other Ambulatory Visit: Payer: Self-pay

## 2019-06-09 ENCOUNTER — Encounter: Payer: Self-pay | Admitting: Emergency Medicine

## 2019-06-09 ENCOUNTER — Inpatient Hospital Stay
Admission: EM | Admit: 2019-06-09 | Discharge: 2019-06-12 | DRG: 872 | Disposition: A | Discharge status: Home / Self Care | Payer: Self-pay | Attending: Internal Medicine | Admitting: Internal Medicine

## 2019-06-09 DIAGNOSIS — H4020X0 Unspecified primary angle-closure glaucoma, stage unspecified: Secondary | ICD-10-CM | POA: Diagnosis present

## 2019-06-09 DIAGNOSIS — Z823 Family history of stroke: Secondary | ICD-10-CM

## 2019-06-09 DIAGNOSIS — F419 Anxiety disorder, unspecified: Secondary | ICD-10-CM

## 2019-06-09 DIAGNOSIS — Z20828 Contact with and (suspected) exposure to other viral communicable diseases: Secondary | ICD-10-CM | POA: Diagnosis present

## 2019-06-09 DIAGNOSIS — N12 Tubulo-interstitial nephritis, not specified as acute or chronic: Secondary | ICD-10-CM

## 2019-06-09 DIAGNOSIS — E1140 Type 2 diabetes mellitus with diabetic neuropathy, unspecified: Secondary | ICD-10-CM | POA: Diagnosis present

## 2019-06-09 DIAGNOSIS — F1010 Alcohol abuse, uncomplicated: Secondary | ICD-10-CM | POA: Diagnosis present

## 2019-06-09 DIAGNOSIS — Z79899 Other long term (current) drug therapy: Secondary | ICD-10-CM

## 2019-06-09 DIAGNOSIS — F17210 Nicotine dependence, cigarettes, uncomplicated: Secondary | ICD-10-CM | POA: Diagnosis present

## 2019-06-09 DIAGNOSIS — R87619 Unspecified abnormal cytological findings in specimens from cervix uteri: Secondary | ICD-10-CM

## 2019-06-09 DIAGNOSIS — E669 Obesity, unspecified: Secondary | ICD-10-CM

## 2019-06-09 DIAGNOSIS — Z8249 Family history of ischemic heart disease and other diseases of the circulatory system: Secondary | ICD-10-CM

## 2019-06-09 DIAGNOSIS — N10 Acute tubulo-interstitial nephritis: Secondary | ICD-10-CM | POA: Diagnosis present

## 2019-06-09 DIAGNOSIS — Z888 Allergy status to other drugs, medicaments and biological substances status: Secondary | ICD-10-CM

## 2019-06-09 DIAGNOSIS — F1121 Opioid dependence, in remission: Secondary | ICD-10-CM | POA: Diagnosis present

## 2019-06-09 DIAGNOSIS — Z833 Family history of diabetes mellitus: Secondary | ICD-10-CM

## 2019-06-09 DIAGNOSIS — Z6838 Body mass index (BMI) 38.0-38.9, adult: Secondary | ICD-10-CM

## 2019-06-09 DIAGNOSIS — Z880 Allergy status to penicillin: Secondary | ICD-10-CM

## 2019-06-09 DIAGNOSIS — A4151 Sepsis due to Escherichia coli [E. coli]: Principal | ICD-10-CM | POA: Diagnosis present

## 2019-06-09 DIAGNOSIS — A419 Sepsis, unspecified organism: Secondary | ICD-10-CM | POA: Diagnosis present

## 2019-06-09 DIAGNOSIS — Z7984 Long term (current) use of oral hypoglycemic drugs: Secondary | ICD-10-CM

## 2019-06-09 DIAGNOSIS — F319 Bipolar disorder, unspecified: Secondary | ICD-10-CM | POA: Diagnosis present

## 2019-06-09 LAB — COMPREHENSIVE METABOLIC PANEL
ALT: 45 U/L — ABNORMAL HIGH (ref 0–44)
ALT: 36 U/L (ref 0–44)
ALT: 35 U/L (ref 0–44)
AST: 35 U/L (ref 15–41)
AST: 29 U/L (ref 15–41)
AST: 30 U/L (ref 15–41)
Albumin: 3.9 g/dL (ref 3.5–5.0)
Albumin: 3.4 g/dL — ABNORMAL LOW (ref 3.5–5.0)
Albumin: 3.4 g/dL — ABNORMAL LOW (ref 3.5–5.0)
Alkaline Phosphatase: 68 U/L (ref 38–126)
Alkaline Phosphatase: 57 U/L (ref 38–126)
Alkaline Phosphatase: 56 U/L (ref 38–126)
Anion gap: 11 (ref 5–15)
Anion gap: 8 (ref 5–15)
Anion gap: 11 (ref 5–15)
BUN: 11 mg/dL (ref 6–20)
BUN: 9 mg/dL (ref 6–20)
BUN: 11 mg/dL (ref 6–20)
CO2: 22 mmol/L (ref 22–32)
CO2: 22 mmol/L (ref 22–32)
CO2: 23 mmol/L (ref 22–32)
Calcium: 8.9 mg/dL (ref 8.9–10.3)
Calcium: 8 mg/dL — ABNORMAL LOW (ref 8.9–10.3)
Calcium: 8.2 mg/dL — ABNORMAL LOW (ref 8.9–10.3)
Chloride: 101 mmol/L (ref 98–111)
Chloride: 104 mmol/L (ref 98–111)
Chloride: 101 mmol/L (ref 98–111)
Creatinine, Ser: 0.93 mg/dL (ref 0.44–1.00)
Creatinine, Ser: 0.85 mg/dL (ref 0.44–1.00)
Creatinine, Ser: 1.1 mg/dL — ABNORMAL HIGH (ref 0.44–1.00)
GFR calc Af Amer: >60 mL/min (ref >60)
GFR calc Af Amer: >60 mL/min (ref >60)
GFR calc Af Amer: >60 mL/min (ref >60)
GFR calc non Af Amer: >60 mL/min (ref >60)
GFR calc non Af Amer: >60 mL/min (ref >60)
GFR calc non Af Amer: >60 mL/min (ref >60)
Glucose, Bld: 300 mg/dL — ABNORMAL HIGH (ref 70–99)
Glucose, Bld: 176 mg/dL — ABNORMAL HIGH (ref 70–99)
Glucose, Bld: 324 mg/dL — ABNORMAL HIGH (ref 70–99)
Potassium: 4.1 mmol/L (ref 3.5–5.1)
Potassium: 3.5 mmol/L (ref 3.5–5.1)
Potassium: 3.7 mmol/L (ref 3.5–5.1)
Sodium: 134 mmol/L — ABNORMAL LOW (ref 135–145)
Sodium: 134 mmol/L — ABNORMAL LOW (ref 135–145)
Sodium: 135 mmol/L (ref 135–145)
Total Bilirubin: 0.8 mg/dL (ref 0.3–1.2)
Total Bilirubin: 0.7 mg/dL (ref 0.3–1.2)
Total Bilirubin: 0.7 mg/dL (ref 0.3–1.2)
Total Protein: 7.3 g/dL (ref 6.5–8.1)
Total Protein: 6.2 g/dL — ABNORMAL LOW (ref 6.5–8.1)
Total Protein: 6.4 g/dL — ABNORMAL LOW (ref 6.5–8.1)

## 2019-06-09 LAB — CBC
HCT: 39.9 % (ref 36.0–46.0)
HCT: 34.3 % — ABNORMAL LOW (ref 36.0–46.0)
Hemoglobin: 13 g/dL (ref 12.0–15.0)
Hemoglobin: 11.2 g/dL — ABNORMAL LOW (ref 12.0–15.0)
MCH: 26.6 pg (ref 26.0–34.0)
MCH: 26.7 pg (ref 26.0–34.0)
MCHC: 32.6 g/dL (ref 30.0–36.0)
MCHC: 32.7 g/dL (ref 30.0–36.0)
MCV: 81.8 fL (ref 80.0–100.0)
MCV: 81.9 fL (ref 80.0–100.0)
Platelets: 189 10*3/uL (ref 150–400)
Platelets: 156 10*3/uL (ref 150–400)
RBC: 4.88 MIL/uL (ref 3.87–5.11)
RBC: 4.19 MIL/uL (ref 3.87–5.11)
RDW: 15.2 % (ref 11.5–15.5)
RDW: 15.2 % (ref 11.5–15.5)
WBC: 9 10*3/uL (ref 4.0–10.5)
WBC: 9.1 10*3/uL (ref 4.0–10.5)
nRBC: 0 % (ref 0.0–0.2)
nRBC: 0 % (ref 0.0–0.2)

## 2019-06-09 LAB — CBC WITH DIFFERENTIAL/PLATELET
Abs Immature Granulocytes: 0.04 10*3/uL (ref 0.00–0.07)
Basophils Absolute: 0 10*3/uL (ref 0.0–0.1)
Basophils Relative: 1 %
Eosinophils Absolute: 0 10*3/uL (ref 0.0–0.5)
Eosinophils Relative: 1 %
HCT: 33.5 % — ABNORMAL LOW (ref 36.0–46.0)
Hemoglobin: 11 g/dL — ABNORMAL LOW (ref 12.0–15.0)
Immature Granulocytes: 1 %
Lymphocytes Relative: 15 %
Lymphs Abs: 1.3 10*3/uL (ref 0.7–4.0)
MCH: 27.2 pg (ref 26.0–34.0)
MCHC: 32.8 g/dL (ref 30.0–36.0)
MCV: 82.9 fL (ref 80.0–100.0)
Monocytes Absolute: 0.6 10*3/uL (ref 0.1–1.0)
Monocytes Relative: 7 %
Neutro Abs: 6.5 10*3/uL (ref 1.7–7.7)
Neutrophils Relative %: 75 %
Platelets: 172 10*3/uL (ref 150–400)
RBC: 4.04 MIL/uL (ref 3.87–5.11)
RDW: 15.2 % (ref 11.5–15.5)
WBC: 8.5 10*3/uL (ref 4.0–10.5)
nRBC: 0 % (ref 0.0–0.2)

## 2019-06-09 LAB — APTT: aPTT: 33 seconds (ref 24–36)

## 2019-06-09 LAB — HEMOGLOBIN A1C
Hgb A1c MFr Bld: 8.9 % — ABNORMAL HIGH (ref 4.8–5.6)
Mean Plasma Glucose: 208.73 mg/dL

## 2019-06-09 LAB — URINALYSIS, COMPLETE (UACMP) WITH MICROSCOPIC
Bilirubin Urine: NEGATIVE
Glucose, UA: >=500 mg/dL — AB
Ketones, ur: NEGATIVE mg/dL
Nitrite: POSITIVE — AB
Protein, ur: 30 mg/dL — AB
Specific Gravity, Urine: 1.022 (ref 1.005–1.030)
WBC, UA: >50 WBC/hpf — ABNORMAL HIGH (ref 0–5)
pH: 5 (ref 5.0–8.0)

## 2019-06-09 LAB — PHOSPHORUS: Phosphorus: 2.7 mg/dL (ref 2.5–4.6)

## 2019-06-09 LAB — PROTIME-INR
INR: 1.1 (ref 0.8–1.2)
Prothrombin Time: 14.5 seconds (ref 11.4–15.2)

## 2019-06-09 LAB — LACTIC ACID, PLASMA: Lactic Acid, Venous: 2.8 mmol/L (ref 0.5–1.9)

## 2019-06-09 LAB — MAGNESIUM: Magnesium: 1.5 mg/dL — ABNORMAL LOW (ref 1.7–2.4)

## 2019-06-09 LAB — LIPASE, BLOOD: Lipase: 45 U/L (ref 11–51)

## 2019-06-09 LAB — GLUCOSE, CAPILLARY: Glucose-Capillary: 227 mg/dL — ABNORMAL HIGH (ref 70–99)

## 2019-06-09 LAB — POCT PREGNANCY, URINE: Preg Test, Ur: NEGATIVE

## 2019-06-09 MED ORDER — LAMOTRIGINE 25 MG PO TABS
150.0000 mg | ORAL_TABLET | Freq: Every day | ORAL | Status: DC
  Administered 2019-06-10 – 2019-06-12 (×3): 150 mg via ORAL
  Filled 2019-06-09 (×3): qty 1

## 2019-06-09 MED ORDER — LORAZEPAM 2 MG/ML IJ SOLN
1.0000 mg | INTRAMUSCULAR | Status: DC | PRN
  Filled 2019-06-09: qty 1

## 2019-06-09 MED ORDER — SODIUM CHLORIDE 0.9 % IV SOLN: 1.0000 g | INTRAVENOUS | Status: DC

## 2019-06-09 MED ORDER — PROMETHAZINE HCL 25 MG/ML IJ SOLN: 12.5000 mg | Freq: Four times a day (QID) | INTRAMUSCULAR | Status: DC | PRN

## 2019-06-09 MED ORDER — SODIUM CHLORIDE 0.9 % IV SOLN
1.0000 g | Freq: Once | INTRAVENOUS | Status: AC
  Administered 2019-06-09: 1 g via INTRAVENOUS
  Filled 2019-06-09: qty 10

## 2019-06-09 MED ORDER — INSULIN ASPART 100 UNIT/ML ~~LOC~~ SOLN
0.0000 [IU] | Freq: Every day | SUBCUTANEOUS | Status: DC
  Administered 2019-06-10 – 2019-06-11 (×2): 2 [IU] via SUBCUTANEOUS
  Filled 2019-06-09 (×2): qty 1

## 2019-06-09 MED ORDER — SODIUM CHLORIDE 0.9 % IV BOLUS
1000.0000 mL | Freq: Once | INTRAVENOUS | Status: AC
  Administered 2019-06-09: 1000 mL via INTRAVENOUS

## 2019-06-09 MED ORDER — GABAPENTIN 400 MG PO CAPS
800.0000 mg | ORAL_CAPSULE | Freq: Three times a day (TID) | ORAL | Status: DC
  Administered 2019-06-10 – 2019-06-12 (×7): 800 mg via ORAL
  Filled 2019-06-09 (×7): qty 2

## 2019-06-09 MED ORDER — PRAZOSIN HCL 2 MG PO CAPS
2.0000 mg | ORAL_CAPSULE | Freq: Every day | ORAL | Status: DC
  Administered 2019-06-10 – 2019-06-11 (×2): 2 mg via ORAL
  Filled 2019-06-09 (×4): qty 1

## 2019-06-09 MED ORDER — ACETAMINOPHEN 325 MG PO TABS
650.0000 mg | ORAL_TABLET | Freq: Four times a day (QID) | ORAL | Status: DC | PRN
  Administered 2019-06-10 – 2019-06-11 (×2): 650 mg via ORAL
  Filled 2019-06-09 (×2): qty 2

## 2019-06-09 MED ORDER — LINAGLIPTIN 5 MG PO TABS
5.0000 mg | ORAL_TABLET | Freq: Every day | ORAL | Status: DC
  Administered 2019-06-10: 5 mg via ORAL
  Filled 2019-06-09: qty 1

## 2019-06-09 MED ORDER — KETOROLAC TROMETHAMINE 30 MG/ML IJ SOLN
15.0000 mg | Freq: Once | INTRAMUSCULAR | Status: AC
  Administered 2019-06-09: 15 mg via INTRAVENOUS
  Filled 2019-06-09: qty 1

## 2019-06-09 MED ORDER — THIAMINE HCL 100 MG/ML IJ SOLN: 100.0000 mg | Freq: Every day | INTRAMUSCULAR | Status: DC

## 2019-06-09 MED ORDER — LORAZEPAM 1 MG PO TABS
0.0000 mg | ORAL_TABLET | Freq: Four times a day (QID) | ORAL | Status: AC
  Administered 2019-06-09 – 2019-06-11 (×3): 1 mg via ORAL
  Filled 2019-06-09 (×2): qty 1
  Filled 2019-06-09: qty 2

## 2019-06-09 MED ORDER — SODIUM CHLORIDE 0.9 % IV SOLN
1.0000 g | INTRAVENOUS | Status: DC
  Administered 2019-06-10 – 2019-06-11 (×2): 1 g via INTRAVENOUS
  Filled 2019-06-09: qty 1
  Filled 2019-06-09 (×2): qty 10

## 2019-06-09 MED ORDER — SODIUM CHLORIDE 0.9% FLUSH
3.0000 mL | Freq: Once | INTRAVENOUS | Status: AC
  Administered 2019-06-09: 3 mL via INTRAVENOUS

## 2019-06-09 MED ORDER — LORAZEPAM 1 MG PO TABS
1.0000 mg | ORAL_TABLET | ORAL | Status: DC | PRN
  Administered 2019-06-10: 1 mg via ORAL
  Filled 2019-06-09: qty 1

## 2019-06-09 MED ORDER — FOLIC ACID 1 MG PO TABS
1.0000 mg | ORAL_TABLET | Freq: Every day | ORAL | Status: DC
  Administered 2019-06-10 – 2019-06-12 (×3): 1 mg via ORAL
  Filled 2019-06-09 (×3): qty 1

## 2019-06-09 MED ORDER — LORAZEPAM 1 MG PO TABS: 0.0000 mg | ORAL_TABLET | Freq: Two times a day (BID) | ORAL | Status: DC

## 2019-06-09 MED ORDER — NICOTINE 21 MG/24HR TD PT24
21.0000 mg | MEDICATED_PATCH | Freq: Every day | TRANSDERMAL | Status: DC
  Administered 2019-06-09 – 2019-06-11 (×3): 21 mg via TRANSDERMAL
  Filled 2019-06-09 (×3): qty 1

## 2019-06-09 MED ORDER — RISPERIDONE 1 MG PO TABS
2.0000 mg | ORAL_TABLET | Freq: Every day | ORAL | Status: DC
  Administered 2019-06-10 – 2019-06-12 (×3): 2 mg via ORAL
  Filled 2019-06-09 (×4): qty 2

## 2019-06-09 MED ORDER — VITAMIN B-1 100 MG PO TABS
100.0000 mg | ORAL_TABLET | Freq: Every day | ORAL | Status: DC
  Administered 2019-06-10 – 2019-06-12 (×3): 100 mg via ORAL
  Filled 2019-06-09 (×3): qty 1

## 2019-06-09 MED ORDER — ACETAMINOPHEN 325 MG PO TABS
650.0000 mg | ORAL_TABLET | Freq: Once | ORAL | Status: AC
  Administered 2019-06-09: 650 mg via ORAL
  Filled 2019-06-09: qty 2

## 2019-06-09 MED ORDER — INSULIN ASPART 100 UNIT/ML ~~LOC~~ SOLN
0.0000 [IU] | Freq: Three times a day (TID) | SUBCUTANEOUS | Status: DC
  Administered 2019-06-10 (×2): 3 [IU] via SUBCUTANEOUS
  Administered 2019-06-10: 5 [IU] via SUBCUTANEOUS
  Administered 2019-06-11: 2 [IU] via SUBCUTANEOUS
  Administered 2019-06-11: 14:00:00 3 [IU] via SUBCUTANEOUS
  Administered 2019-06-11: 2 [IU] via SUBCUTANEOUS
  Administered 2019-06-12: 10:00:00 1 [IU] via SUBCUTANEOUS
  Filled 2019-06-09 (×7): qty 1

## 2019-06-09 MED ORDER — ADULT MULTIVITAMIN W/MINERALS CH
1.0000 | ORAL_TABLET | Freq: Every day | ORAL | Status: DC
  Administered 2019-06-10 – 2019-06-12 (×3): 1 via ORAL
  Filled 2019-06-09 (×3): qty 1

## 2019-06-09 MED ORDER — IOHEXOL 300 MG/ML  SOLN
100.0000 mL | Freq: Once | INTRAMUSCULAR | Status: AC | PRN
  Administered 2019-06-09: 100 mL via INTRAVENOUS
  Filled 2019-06-09: qty 100

## 2019-06-09 NOTE — ED Provider Notes (Signed)
[facility] Emergency Department Provider Note    First MD Initiated Contact with Patient 06/09/19 1353     (approximate)  I have reviewed the triage vital signs and the nursing notes.   HISTORY  Chief Complaint Abdominal Pain and Fever    HPI Yvonne Torres is a 48 y.o. female below listed past medical history presents the ER for progressively worsening right lower quadrant pain and low back pain associated with some increased urinary frequency and dysuria.  Is having fevers nightly.  Denies any history of known kidney stones.  Denies any vaginal bleeding or discharge.  Has not been on antibiotics recently.  Has been taking antipyretics around-the-clock still feeling very sick.  Was scheduled for outpatient ultrasound but started having worsening right lower quadrant pain for which she presents to the ER.    Past Medical History:  Diagnosis Date  . Bipolar 1 disorder ([place])   . Diabetes mellitus without complication ([place])   . Glaucoma, narrow-angle 2015  . Opioid dependence in remission [facility]) 2019   Reports she went through residential treatment program and is clean as of June 2019   Family History  Problem Relation Age of Onset  . Breast cancer Mother 60  . Diabetes Mother   . Diabetes Father   . Heart disease Father   . Hypertension Father   . Stroke Father   . Diabetes Brother   . Depression Brother    Past Surgical History:  Procedure Laterality Date  . ECTOPIC PREGNANCY SURGERY    . TONSILLECTOMY     Patient Active Problem List   Diagnosis Date Noted  . Anxiety 06/23/2018  . Depression 06/23/2018  . Use of nonprescription opiate drugs ([place]) 04/23/2018  . Bipolar I disorder, current or most recent episode manic, with psychotic features ([place]) 04/22/2018  . Alcohol use disorder, moderate, dependence ([place]) 04/22/2018  . PTSD (post-traumatic stress disorder) 04/22/2018  . OCD (obsessive compulsive disorder) 04/22/2018  . Tobacco  use disorder 04/22/2018  . Cocaine use disorder, moderate, dependence ([place]) 04/22/2018  . Abnormal Papanicolaou smear of cervix 08/16/2017  . Obesity 12/17/2013      Prior to Admission medications   Medication Sig Start Date End Date Taking? Authorizing Provider  blood glucose meter kit and supplies Dispense based on patient and insurance preference. Use up to four times daily as directed. (FOR ICD-10 E10.9, E11.9). 04/25/18   McNew, Tyson Babinski, MD  EPINEPHrine 0.3 mg/0.3 mL IJ SOAJ injection Inject 0.3 mLs (0.3 mg total) into the muscle once. Follow package instructions as needed for severe allergy or anaphylactic reaction. 09/23/15   Carrie Mew, MD  gabapentin (NEURONTIN) 800 MG tablet Take 1 tablet (800 mg total) by mouth 3 (three) times daily. 04/27/18   Lenward Chancellor, MD  hydrOXYzine (ATARAX/VISTARIL) 25 MG tablet Take 1 tablet (25 mg total) by mouth every 6 (six) hours as needed for anxiety. 04/25/18   McNew, Tyson Babinski, MD  lamoTRIgine (LAMICTAL) 100 MG tablet Take 0.5-1 tablets (50-100 mg total) by mouth at bedtime. Take 1/2 tablet for 14 days and then increase to full tablet for 14 days. 05/05/18   McNew, Tyson Babinski, MD  lamoTRIgine (LAMICTAL) 150 MG tablet Take 1 tablet (150 mg total) by mouth daily. 06/02/18   McNew, Tyson Babinski, MD  linagliptin (TRADJENTA) 5 MG TABS tablet Take 1 tablet (5 mg total) by mouth daily. 04/26/18   McNew, Tyson Babinski, MD  nicotine (NICODERM CQ - DOSED IN MG/24 HOURS) 21 mg/24hr  patch Place 1 patch (21 mg total) onto the skin daily. 04/26/18   McNew, Tyson Babinski, MD  prazosin (MINIPRESS) 2 MG capsule Take 1 capsule (2 mg total) by mouth at bedtime. 04/25/18   McNew, Tyson Babinski, MD  risperiDONE (RISPERDAL) 1 MG tablet Take 1 tablet (1 mg total) by mouth 2 (two) times daily. 04/25/18   McNew, Tyson Babinski, MD    Allergies Penicillins, Red dye, and Tessalon [benzonatate]    Social History Social History   Tobacco Use  . Smoking status: Current Some Day Smoker    Packs/day:  0.25    Types: Cigarettes  . Smokeless tobacco: Never Used  Substance Use Topics  . Alcohol use: Yes  . Drug use: Not on file    Review of Systems Patient denies headaches, rhinorrhea, blurry vision, numbness, shortness of breath, chest pain, edema, cough, abdominal pain, nausea, vomiting, diarrhea, dysuria, fevers, rashes or hallucinations unless otherwise stated above in HPI. ____________________________________________   PHYSICAL EXAM:  VITAL SIGNS: Vitals:   06/09/19 1119 06/09/19 1504  BP: (!) 141/75 135/85  Pulse: (!) 114 100  Resp: 16 18  Temp: (!) 101.7 F (38.7 C)   SpO2: 96% 99%    Constitutional: Alert and oriented.  Eyes: Conjunctivae are normal.  Head: Atraumatic. Nose: No congestion/rhinnorhea. Mouth/Throat: Mucous membranes are moist.   Neck: No stridor. Painless ROM.  Cardiovascular: Normal rate, regular rhythm. Grossly normal heart sounds.  Good peripheral circulation. Respiratory: Normal respiratory effort.  No retractions. Lungs CTAB. Gastrointestinal: Soft with ttp in RLQ.  No guarding. No distention. No abdominal bruits. No CVA tenderness. Genitourinary: deferred Musculoskeletal: No lower extremity tenderness nor edema.  No joint effusions. Neurologic:  Normal speech and language. No gross focal neurologic deficits are appreciated. No facial droop Skin:  Skin is warm, dry and intact. No rash noted. Psychiatric: Mood and affect are normal. Speech and behavior are normal.  ____________________________________________   LABS (all labs ordered are listed, but only abnormal results are displayed)  Results for orders placed or performed during the hospital encounter of 06/09/19 (from the past 24 hour(s))  Lipase, blood     Status: None   Collection Time: 06/09/19 11:31 AM  Result Value Ref Range   Lipase 45 11 - 51 U/L  Comprehensive metabolic panel     Status: Abnormal   Collection Time: 06/09/19 11:31 AM  Result Value Ref Range   Sodium 134 (L)  135 - 145 mmol/L   Potassium 4.1 3.5 - 5.1 mmol/L   Chloride 101 98 - 111 mmol/L   CO2 22 22 - 32 mmol/L   Glucose, Bld 300 (H) 70 - 99 mg/dL   BUN 11 6 - 20 mg/dL   Creatinine, Ser 0.93 0.44 - 1.00 mg/dL   Calcium 8.9 8.9 - 10.3 mg/dL   Total Protein 7.3 6.5 - 8.1 g/dL   Albumin 3.9 3.5 - 5.0 g/dL   AST 35 15 - 41 U/L   ALT 45 (H) 0 - 44 U/L   Alkaline Phosphatase 68 38 - 126 U/L   Total Bilirubin 0.8 0.3 - 1.2 mg/dL   GFR calc non Af Amer >60 >60 mL/min   GFR calc Af Amer >60 >60 mL/min   Anion gap 11 5 - 15  CBC     Status: None   Collection Time: 06/09/19 11:31 AM  Result Value Ref Range   WBC 9.0 4.0 - 10.5 K/uL   RBC 4.88 3.87 - 5.11 MIL/uL   Hemoglobin 13.0 12.0 -  15.0 g/dL   HCT 39.9 36.0 - 46.0 %   MCV 81.8 80.0 - 100.0 fL   MCH 26.6 26.0 - 34.0 pg   MCHC 32.6 30.0 - 36.0 g/dL   RDW 15.2 11.5 - 15.5 %   Platelets 189 150 - 400 K/uL   nRBC 0.0 0.0 - 0.2 %  Urinalysis, Complete w Microscopic     Status: Abnormal   Collection Time: 06/09/19 11:31 AM  Result Value Ref Range   Color, Urine YELLOW (A) YELLOW   APPearance CLOUDY (A) CLEAR   Specific Gravity, Urine 1.022 1.005 - 1.030   pH 5.0 5.0 - 8.0   Glucose, UA >=500 (A) NEGATIVE mg/dL   Hgb urine dipstick SMALL (A) NEGATIVE   Bilirubin Urine NEGATIVE NEGATIVE   Ketones, ur NEGATIVE NEGATIVE mg/dL   Protein, ur 30 (A) NEGATIVE mg/dL   Nitrite POSITIVE (A) NEGATIVE   Leukocytes,Ua MODERATE (A) NEGATIVE   RBC / HPF 6-10 0 - 5 RBC/hpf   WBC, UA >50 (H) 0 - 5 WBC/hpf   Bacteria, UA RARE (A) NONE SEEN   Squamous Epithelial / LPF 6-10 0 - 5   WBC Clumps PRESENT    Mucus PRESENT   Pregnancy, urine POC     Status: None   Collection Time: 06/09/19 11:37 AM  Result Value Ref Range   Preg Test, Ur NEGATIVE NEGATIVE   ____________________________________________ ____________________________________________  RADIOLOGY  I personally reviewed all radiographic images ordered to evaluate for the above acute  complaints and reviewed radiology reports and findings.  These findings were personally discussed with the patient.  Please see medical record for radiology report.  ____________________________________________   PROCEDURES  Procedure(s) performed:  Procedures    Critical Care performed: no ____________________________________________   INITIAL IMPRESSION / ASSESSMENT AND PLAN / ED COURSE  Pertinent labs & imaging results that were available during my care of the patient were reviewed by me and considered in my medical decision making (see chart for details).   DDX: Cystitis, pyelonephritis, stone, appendicitis, colitis, abscess  Yvonne Torres is a 48 y.o. who presents to the ED with symptoms as described above.  Patient with fever mild tachycardia infected appearing urine but with right lower quadrant pain also concerning for appendicitis.  CT imaging will be ordered given to evaluate for the blood differential.  Will provide IV fluids as well as IV antibiotics.  Clinical Course as of Jun 08 1601  Tue Jun 09, 2019  1602 CT imaging shows evidence of pyelonephritis and lobar nephronia.  She still having severe pain nausea as well.  Will give IV fluids.  She refusing any narcotic medications.  Will give Toradol.   [PR]    Clinical Course User Index [PR] Merlyn Lot, MD    The patient was evaluated in Emergency Department today for the symptoms described in the history of present illness. He/she was evaluated in the context of the global COVID-19 pandemic, which necessitated consideration that the patient might be at risk for infection with the SARS-CoV-2 virus that causes COVID-19. Institutional protocols and algorithms that pertain to the evaluation of patients at risk for COVID-19 are in a state of rapid change based on information released by regulatory bodies including the CDC and federal and state organizations. These policies and algorithms were followed during the  patient's care in the ED.  As part of my medical decision making, I reviewed the following data within the [place] notes reviewed and incorporated, Labs reviewed,  notes from prior ED visits and [place] Controlled Substance Database   ____________________________________________   FINAL CLINICAL IMPRESSION(S) / ED DIAGNOSES  Final diagnoses:  Pyelonephritis      NEW MEDICATIONS STARTED DURING THIS VISIT:  New Prescriptions   No medications on file     Note:  This document was prepared using Dragon voice recognition software and may include unintentional dictation errors.    Merlyn Lot, MD 06/09/19 [phone]

## 2019-06-09 NOTE — ED Triage Notes (Addendum)
Says low abd pain and now has fever since last night.  Pain low back as well.  Says shes been seeing her doc for the pain, but never had fever. Going on for 2 months. PREFERS NO NARCOTICS BECAUSE SHE IS AN ADDICT.

## 2019-06-09 NOTE — ED Notes (Signed)
Pt stuck 2X unable to gave access, RN Anderson Malta at bedside

## 2019-06-09 NOTE — ED Notes (Addendum)
Attempted to call floor for pt. Per [employer] she has not had chance to look at chart. Will call back in few. RN Charge Raquel informed

## 2019-06-09 NOTE — H&P (Signed)
[place] [facility] [place] NAME: Yvonne Torres    MR#:  [id number]  DATE OF BIRTH:  Apr 25, 1971  DATE OF ADMISSION:  06/09/2019  PRIMARY CARE PHYSICIAN: [facility] [place]   REQUESTING/REFERRING PHYSICIAN: Dr. Quentin Cornwall  CHIEF COMPLAINT:  Abdominal pain and fever  HISTORY OF PRESENT ILLNESS:  Yvonne Torres  is a 48 y.o. female with a known history of diabetes mellitus, bipolar disorder, opiate dependence on admission and multiple other medical problems is presenting to the ED with a chief complaint of right lower quadrant abdominal pain associated with nausea and abdominal pain was radiating to the lower back on the right side.  Patient is also reporting urinary frequency and dysuria.  Had one episode of vomiting last night and high-grade fever today.  Urinalysis is abnormal patient is started on IV Rocephin and hospitalist team is called to admit the patient.  Patient is having chills during my examination  PAST MEDICAL HISTORY:   Past Medical History:  Diagnosis Date  . Bipolar 1 disorder ([place])   . Diabetes mellitus without complication ([place])   . Glaucoma, narrow-angle 2015  . Opioid dependence in remission [facility]) 2019   Reports she went through residential treatment program and is clean as of June 2019    PAST SURGICAL HISTOIRY:   Past Surgical History:  Procedure Laterality Date  . ECTOPIC PREGNANCY SURGERY    . TONSILLECTOMY      SOCIAL HISTORY:   Social History   Tobacco Use  . Smoking status: Current Some Day Smoker    Packs/day: 0.25    Types: Cigarettes  . Smokeless tobacco: Never Used  Substance Use Topics  . Alcohol use: Yes    FAMILY HISTORY:   Family History  Problem Relation Age of Onset  . Breast cancer Mother 46  . Diabetes Mother   . Diabetes Father   . Heart disease Father   . Hypertension Father   . Stroke Father   . Diabetes Brother   . Depression Brother     DRUG  ALLERGIES:   Allergies  Allergen Reactions  . Penicillins   . Red Dye Rash  . Tessalon [Benzonatate] Rash    REVIEW OF SYSTEMS:  CONSTITUTIONAL: Has chills and fever, denies fatigue or weakness.  EYES: No blurred or double vision.  EARS, NOSE, AND THROAT: No tinnitus or ear pain.  RESPIRATORY: No cough, shortness of breath, wheezing or hemoptysis.  CARDIOVASCULAR: No chest pain, orthopnea, edema.  GASTROINTESTINAL: Reports nausea, vomiting, denies diarrhea, patient has lower abdominal pain.  GENITOURINARY: Reporting frequent urination and dysuria, denies hematuria.  ENDOCRINE: No polyuria, nocturia,  HEMATOLOGY: No anemia, easy bruising or bleeding SKIN: No rash or lesion. MUSCULOSKELETAL: No joint pain or arthritis.   NEUROLOGIC: No tingling, numbness, weakness.  PSYCHIATRY: No anxiety or depression.   MEDICATIONS AT HOME:   Prior to Admission medications   Medication Sig Start Date End Date Taking? Authorizing Provider  gabapentin (NEURONTIN) 800 MG tablet Take 1 tablet (800 mg total) by mouth 3 (three) times daily. 04/27/18  Yes Lenward Chancellor, MD  lamoTRIgine (LAMICTAL) 150 MG tablet Take 1 tablet (150 mg total) by mouth daily. 06/02/18  Yes McNew, Tyson Babinski, MD  linagliptin (TRADJENTA) 5 MG TABS tablet Take 1 tablet (5 mg total) by mouth daily. 04/26/18  Yes McNew, Tyson Babinski, MD  prazosin (MINIPRESS) 2 MG capsule Take 1 capsule (2 mg total) by mouth at bedtime. 04/25/18  Yes McNew, Tyson Babinski, MD  risperiDONE (RISPERDAL) 2 MG tablet Take 2 mg by mouth daily. 12/17/18  Yes [provider]  blood glucose meter kit and supplies Dispense based on patient and insurance preference. Use up to four times daily as directed. (FOR ICD-10 E10.9, E11.9). 04/25/18   McNew, Tyson Babinski, MD  EPINEPHrine 0.3 mg/0.3 mL IJ SOAJ injection Inject 0.3 mLs (0.3 mg total) into the muscle once. Follow package instructions as needed for severe allergy or anaphylactic reaction. 09/23/15   Carrie Mew, MD   hydrOXYzine (ATARAX/VISTARIL) 25 MG tablet Take 1 tablet (25 mg total) by mouth every 6 (six) hours as needed for anxiety. Patient not taking: Reported on 06/09/2019 04/25/18   Marylin Crosby, MD  nicotine (NICODERM CQ - DOSED IN MG/24 HOURS) 21 mg/24hr patch Place 1 patch (21 mg total) onto the skin daily. 04/26/18   McNew, Tyson Babinski, MD      VITAL SIGNS:  Blood pressure 126/72, pulse 89, temperature 99.4 F (37.4 C), resp. rate 18, height 5' 4"  (1.626 m), weight 101.6 kg, last menstrual period 06/02/2019, SpO2 99 %.  PHYSICAL EXAMINATION:  GENERAL:  48 y.o.-year-old patient lying in the bed with no acute distress.  EYES: Pupils equal, round, reactive to light and accommodation. No scleral icterus. Extraocular muscles intact.  HEENT: Head atraumatic, normocephalic. Oropharynx and nasopharynx clear.  NECK:  Supple, no jugular venous distention. No thyroid enlargement, no tenderness.  LUNGS: Normal breath sounds bilaterally, no wheezing, rales,rhonchi or crepitation. No use of accessory muscles of respiration.  CARDIOVASCULAR: S1, S2 normal. No murmurs, rubs, or gallops.  ABDOMEN: Soft, lower abdominal tenderness is present no rebound tenderness nondistended. Bowel sounds present.  Right flank is tender EXTREMITIES: No pedal edema, cyanosis, or clubbing.  NEUROLOGIC: Cranial nerves II through XII are intact. Muscle strength 5/5 in all extremities. Sensation intact. Gait not checked.  PSYCHIATRIC: The patient is alert and oriented x 3.  SKIN: No obvious rash, lesion, or ulcer.   LABORATORY PANEL:   CBC Recent Labs  Lab 06/09/19 1131  WBC 9.0  HGB 13.0  HCT 39.9  PLT 189   ------------------------------------------------------------------------------------------------------------------  Chemistries  Recent Labs  Lab 06/09/19 1131  NA 134*  K 4.1  CL 101  CO2 22  GLUCOSE 300*  BUN 11  CREATININE 0.93  CALCIUM 8.9  AST 35  ALT 45*  ALKPHOS 68  BILITOT 0.8    ------------------------------------------------------------------------------------------------------------------  Cardiac Enzymes No results for input(s): TROPONINI in the last 168 hours. ------------------------------------------------------------------------------------------------------------------  RADIOLOGY:  Ct Abdomen Pelvis W Contrast  Result Date: 06/09/2019 CLINICAL DATA:  Abdominal pain with nausea and fever EXAM: CT ABDOMEN AND PELVIS WITH CONTRAST TECHNIQUE: Multidetector CT imaging of the abdomen and pelvis was performed using the standard protocol following bolus administration of intravenous contrast. CONTRAST:  167m OMNIPAQUE IOHEXOL 300 MG/ML  SOLN COMPARISON:  None. FINDINGS: Lower chest: There is no lung base edema or consolidation. There is a 3 mm nodular opacity in the lateral segment of the left lower lobe. Hepatobiliary: Liver measures 22.6 cm in length. No focal liver lesions are evident. The gallbladder wall is not appreciably thickened. There is no evident biliary duct dilatation. Pancreas: There is no pancreatic mass or inflammatory focus. Spleen: Spleen measures 16.9 x 13.2 x 8.2 cm with a measured splenic volume of 915 cubic cm. No focal splenic lesions are evident. Adrenals/Urinary Tract: Adrenals bilaterally appear unremarkable. There is an area of decreased enhancement in the medial mid left kidney. A similar appearing area is noted in the  anterior aspect of the mid to lower portion of the left kidney. A more subtle area of decreased enhancement is noted along the lateral periphery of the mid to lower portion of the left kidney. No similar changes evident on the right. No hydronephrosis is appreciable on the right. There is no evident renal or ureteral calculus on either side. Urinary bladder is midline with wall thickness within normal limits. Stomach/Bowel: There is no appreciable bowel wall or mesenteric thickening. No evident bowel obstruction. Terminal ileum  appears unremarkable. There is no free air or portal venous air. Vascular/Lymphatic: No abdominal aortic aneurysm. There is slight calcification in the distal aorta. No other appreciable vascular calcification evident. There is no demonstrable adenopathy by size criteria in the abdomen or pelvis. There are occasional subcentimeter retroperitoneal lymph nodes which do not meet size criteria for pathologic significance. Reproductive: Uterus is anteverted.  No evident pelvic mass. Other: Appendix appears normal. No evident abscess or ascites in the abdomen or pelvis. Musculoskeletal: No blastic or lytic bone lesions. There are no intramuscular or abdominal wall lesions. IMPRESSION: 1. Areas of relative decreased enhancement in the renal parenchyma on the left. This appearance is suggestive of bacterial nephritis/lobar nephronia on the left. Note that there is no appreciable perinephric stranding or fluid on the left. No hydronephrosis appreciable. 2. Enlargement of the liver and spleen of uncertain etiology. No focal liver or splenic lesions are evident. 3. No bowel obstruction. No abscess in the abdomen or pelvis. Appendix appears normal. 4. No renal or ureteral calculi evident. Urinary bladder wall thickness is within normal limits. 5. 3 mm nodular opacity in the lateral segment of the left lower lobe. No follow-up needed if patient is low-risk. Non-contrast chest CT can be considered in 12 months if patient is high-risk. This recommendation follows the consensus statement: Guidelines for Management of Incidental Pulmonary Nodules Detected on CT Images: From the Fleischner Society 2017; Radiology 2017; 284:228-243. Electronically Signed   By: Lowella Grip III M.D.   On: 06/09/2019 15:16    EKG:   Orders placed or performed during the hospital encounter of 04/05/18  . ED EKG  . ED EKG  . EKG    IMPRESSION AND PLAN:   #Sepsis from acute pyelonephritis Patient met septic criteria at the time of  admission with high-grade fever, tachycardia IV fluids, IV Rocephin Pending urine culture and sensitivity Pain management as needed  #Diabetes mellitus type 2 Sliding scale insulin, glipizide  #Bipolar disorder continue home medication Lamictal and risperidone  #Diabetic neuropathy Neurontin  #Alcohol abuse disorder Outpatient alcohol Anonymous, CIWA  #Tobacco abuse disorder counseled patient to quit smoking completely for 4 minutes.  Patient verbalized understanding.  Will provide nicotine patch    All the records are reviewed and case discussed with ED provider. Management plans discussed with the patient, family and they are in agreement.  CODE STATUS: fc   TOTAL TIME TAKING CARE OF THIS PATIENT: 43 minutes.   Note: This dictation was prepared with Dragon dictation along with smaller phrase technology. Any transcriptional errors that result from this process are unintentional.  Nicholes Mango M.D on 06/09/2019 at 6:11 PM  Between 7am to 6pm - Pager - [phone]  After 6pm go to [email or web address] - password [email or web address] [place] [facility]  Office  [phone]  CC: Primary care physician; [facility]

## 2019-06-09 NOTE — Progress Notes (Signed)
Received call from [place] for report 1 minute after approval, this RN unable to review notes or ED handoff.  RN requested verbal report, informed all information in handoff - this RN to return call if any questions.

## 2019-06-09 NOTE — Progress Notes (Signed)
Family Meeting Note  Advance Directive:yes  Today a meeting took place with the Patient.    The following clinical team members were present during this meeting:MD  The following were discussed:Patient's diagnosis: Sepsis with acute pyonephritis, history of bipolar disorder, tobacco abuse and his alcohol abuse is presenting with abdominal pain.  Patient will be admitted to the hospital.  Treatment plan of care discussed in detail with the patient she verbalized understanding of the plan   , Patient's progosis: Unable to determine and Goals for treatment: Full Code  Husband healthcare power of attorney  Additional follow-up to be provided: Hospitalist  Time spent during discussion:17 min   Nicholes Mango, MD

## 2019-06-09 NOTE — ED Notes (Signed)
Pt back from CT

## 2019-06-09 NOTE — ED Notes (Signed)
Patient transported to CT 

## 2019-06-10 ENCOUNTER — Ambulatory Visit: Payer: Self-pay

## 2019-06-10 LAB — GLUCOSE, CAPILLARY
Glucose-Capillary: 278 mg/dL — ABNORMAL HIGH (ref 70–99)
Glucose-Capillary: 247 mg/dL — ABNORMAL HIGH (ref 70–99)
Glucose-Capillary: 219 mg/dL — ABNORMAL HIGH (ref 70–99)
Glucose-Capillary: 185 mg/dL — ABNORMAL HIGH (ref 70–99)

## 2019-06-10 LAB — LACTIC ACID, PLASMA: Lactic Acid, Venous: 1.7 mmol/L (ref 0.5–1.9)

## 2019-06-10 LAB — SARS CORONAVIRUS 2 (TAT 6-24 HRS): SARS Coronavirus 2: NEGATIVE

## 2019-06-10 MED ORDER — LACTATED RINGERS IV SOLN
INTRAVENOUS | Status: AC
  Administered 2019-06-10: 01:00:00 via INTRAVENOUS

## 2019-06-10 MED ORDER — ENOXAPARIN SODIUM 40 MG/0.4ML ~~LOC~~ SOLN
40.0000 mg | SUBCUTANEOUS | Status: DC
  Filled 2019-06-10: qty 0.4

## 2019-06-10 MED ORDER — INSULIN GLARGINE 100 UNIT/ML ~~LOC~~ SOLN
20.0000 [IU] | Freq: Every day | SUBCUTANEOUS | Status: DC
  Administered 2019-06-10 – 2019-06-12 (×3): 20 [IU] via SUBCUTANEOUS
  Filled 2019-06-10 (×3): qty 0.2

## 2019-06-10 MED ORDER — KETOROLAC TROMETHAMINE 30 MG/ML IJ SOLN
30.0000 mg | Freq: Four times a day (QID) | INTRAMUSCULAR | Status: DC | PRN
  Administered 2019-06-10 (×2): 30 mg via INTRAVENOUS
  Filled 2019-06-10 (×2): qty 1

## 2019-06-10 MED ORDER — SENNOSIDES-DOCUSATE SODIUM 8.6-50 MG PO TABS
2.0000 | ORAL_TABLET | Freq: Two times a day (BID) | ORAL | Status: DC
  Administered 2019-06-10 – 2019-06-12 (×4): 2 via ORAL
  Filled 2019-06-10 (×4): qty 2

## 2019-06-10 MED ORDER — SODIUM CHLORIDE 0.9 % IV SOLN
INTRAVENOUS | Status: DC | PRN
  Administered 2019-06-10 – 2019-06-11 (×2): 30 mL via INTRAVENOUS

## 2019-06-10 MED ORDER — SODIUM CHLORIDE 0.9 % IV BOLUS
1000.0000 mL | Freq: Once | INTRAVENOUS | Status: AC
  Administered 2019-06-10: 1000 mL via INTRAVENOUS

## 2019-06-10 NOTE — Progress Notes (Addendum)
Brief overnight report  Subjective: Per primary nurse patient continues to have elevated temp despite intervention.  Report intermittent low back pain rating it 5 out of 10 without other associated symptoms.  Objective: Temperature 103 (39.4), heart rate 110, blood pressure 127/73.  Assessment: 48 year old female with known history of DM, bipolar disorder, opiate dependence and glaucoma presenting with complaints of right lower quadrant abdominal pain associated with nausea, urinary frequency and dysuria.  He is found to meet sepsis criteria secondary to acute pyelonephritis.  Plan Sepsis secondary to acute pyelonephritis - Patient received a total of 2 L normal saline bolus - Given continued fever will give additional 1 L normal saline bolus - Continue IV Rocephin pending urine cultures - Continue IV fluids - Monitor fever curve - Toradol for pain - Trend WBC   Rufina Falco, DNP, CCRN, FNP-BC [facility] Nurse Practitioner Between 7am to 6pm - Pager - [phone]  After 6pm go to [email or web address] - Proofreader  [employer]  [phone]

## 2019-06-10 NOTE — Progress Notes (Signed)
Spoke with Dr. Darvin Neighbours about switching pt diet. Orders are to start diabetic diet, and IV fluids.

## 2019-06-10 NOTE — Progress Notes (Signed)
[place] [facility] [place] NAME: Yvonne Torres    MR#:  [id number]  DATE OF BIRTH:  [birth year]  SUBJECTIVE:  CHIEF COMPLAINT:   Chief Complaint  Patient presents with  . Abdominal Pain  . Fever  Pain somewhat better, afebrile REVIEW OF SYSTEMS:  Review of Systems  Constitutional: Negative for diaphoresis, fever, malaise/fatigue and weight loss.  HENT: Negative for ear discharge, ear pain, hearing loss, nosebleeds, sore throat and tinnitus.   Eyes: Negative for blurred vision and pain.  Respiratory: Negative for cough, hemoptysis, shortness of breath and wheezing.   Cardiovascular: Negative for chest pain, palpitations, orthopnea and leg swelling.  Gastrointestinal: Positive for abdominal pain. Negative for blood in stool, constipation, diarrhea, heartburn, nausea and vomiting.  Genitourinary: Negative for dysuria, frequency and urgency.  Musculoskeletal: Negative for back pain and myalgias.  Skin: Negative for itching and rash.  Neurological: Negative for dizziness, tingling, tremors, focal weakness, seizures, weakness and headaches.  Psychiatric/Behavioral: Negative for depression. The patient is not nervous/anxious.     DRUG ALLERGIES:   Allergies  Allergen Reactions  . Penicillins   . Red Dye Rash  . Tessalon [Benzonatate] Rash   VITALS:  Blood pressure 99/75, pulse 95, temperature 97.9 F (36.6 C), temperature source Oral, resp. rate 16, height 5\' 4"  (1.626 m), weight 101.6 kg, last menstrual period 06/02/2019, SpO2 100 %. PHYSICAL EXAMINATION:  Physical Exam HENT:     Head: Normocephalic and atraumatic.  Eyes:     Conjunctiva/sclera: Conjunctivae normal.     Pupils: Pupils are equal, round, and reactive to light.  Neck:     Musculoskeletal: Normal range of motion and neck supple.     Thyroid: No thyromegaly.     Trachea: No tracheal deviation.  Cardiovascular:     Rate and Rhythm: Normal rate and regular  rhythm.     Heart sounds: Normal heart sounds.  Pulmonary:     Effort: Pulmonary effort is normal. No respiratory distress.     Breath sounds: Normal breath sounds. No wheezing.  Chest:     Chest wall: No tenderness.  Abdominal:     General: Bowel sounds are normal. There is no distension.     Palpations: Abdomen is soft.     Tenderness: There is no abdominal tenderness.  Musculoskeletal: Normal range of motion.  Skin:    General: Skin is warm and dry.     Findings: No rash.  Neurological:     Mental Status: She is alert and oriented to person, place, and time.     Cranial Nerves: No cranial nerve deficit.    LABORATORY PANEL:  Female CBC Recent Labs  Lab 06/09/19 2148  WBC 8.5  HGB 11.0*  HCT 33.5*  PLT 172   ------------------------------------------------------------------------------------------------------------------ Chemistries  Recent Labs  Lab 06/09/19 1927 06/09/19 2148  NA 134* 135  K 3.5 3.7  CL 104 101  CO2 22 23  GLUCOSE 176* 324*  BUN 9 11  CREATININE 0.85 1.10*  CALCIUM 8.0* 8.2*  MG 1.5*  --   AST 29 30  ALT 36 35  ALKPHOS 57 56  BILITOT 0.7 0.7   RADIOLOGY:  Ct Abdomen Pelvis W Contrast  Result Date: 06/09/2019 CLINICAL DATA:  Abdominal pain with nausea and fever EXAM: CT ABDOMEN AND PELVIS WITH CONTRAST TECHNIQUE: Multidetector CT imaging of the abdomen and pelvis was performed using the standard protocol following bolus administration of intravenous contrast. CONTRAST:  OMNIPAQUE IOHEXOL 300 MG/ML  SOLN COMPARISON:  None. FINDINGS: Lower chest: There is no lung base edema or consolidation. There is a 3 mm nodular opacity in the lateral segment of the left lower lobe. Hepatobiliary: Liver measures 22.6 cm in length. No focal liver lesions are evident. The gallbladder wall is not appreciably thickened. There is no evident biliary duct dilatation. Pancreas: There is no pancreatic mass or inflammatory focus. Spleen: Spleen measures 16.9 x 13.2  x 8.2 cm with a measured splenic volume of 915 cubic cm. No focal splenic lesions are evident. Adrenals/Urinary Tract: Adrenals bilaterally appear unremarkable. There is an area of decreased enhancement in the medial mid left kidney. A similar appearing area is noted in the anterior aspect of the mid to lower portion of the left kidney. A more subtle area of decreased enhancement is noted along the lateral periphery of the mid to lower portion of the left kidney. No similar changes evident on the right. No hydronephrosis is appreciable on the right. There is no evident renal or ureteral calculus on either side. Urinary bladder is midline with wall thickness within normal limits. Stomach/Bowel: There is no appreciable bowel wall or mesenteric thickening. No evident bowel obstruction. Terminal ileum appears unremarkable. There is no free air or portal venous air. Vascular/Lymphatic: No abdominal aortic aneurysm. There is slight calcification in the distal aorta. No other appreciable vascular calcification evident. There is no demonstrable adenopathy by size criteria in the abdomen or pelvis. There are occasional subcentimeter retroperitoneal lymph nodes which do not meet size criteria for pathologic significance. Reproductive: Uterus is anteverted.  No evident pelvic mass. Other: Appendix appears normal. No evident abscess or ascites in the abdomen or pelvis. Musculoskeletal: No blastic or lytic bone lesions. There are no intramuscular or abdominal wall lesions. IMPRESSION: 1. Areas of relative decreased enhancement in the renal parenchyma on the left. This appearance is suggestive of bacterial nephritis/lobar nephronia on the left. Note that there is no appreciable perinephric stranding or fluid on the left. No hydronephrosis appreciable. 2. Enlargement of the liver and spleen of uncertain etiology. No focal liver or splenic lesions are evident. 3. No bowel obstruction. No abscess in the abdomen or pelvis. Appendix  appears normal. 4. No renal or ureteral calculi evident. Urinary bladder wall thickness is within normal limits. 5. 3 mm nodular opacity in the lateral segment of the left lower lobe. No follow-up needed if patient is low-risk. Non-contrast chest CT can be considered in 12 months if patient is high-risk. This recommendation follows the consensus statement: Guidelines for Management of Incidental Pulmonary Nodules Detected on CT Images: From the Fleischner Society 2017; Radiology 2017; 284:228-243. Electronically Signed   By: Bretta Bang III M.D.   On: 06/09/2019 15:16   ASSESSMENT AND PLAN:  48 year old female with known history of DM, bipolar disorder, opiate dependence and glaucoma presenting with complaints of right lower quadrant abdominal pain associated with nausea, urinary frequency and dysuria.  found to meet sepsis criteria secondary to acute pyelonephritis  #Sepsis from acute pyelonephritis -present on admission Continue IV fluids, IV Rocephin Await urine culture and sensitivity Pain management as needed  #Diabetes mellitus type 2 -D/C Tradjenta -Lantus 20 units daily -Sliding scale  #Bipolar disorder continue home medication Lamictal and risperidone  #Diabetic neuropathy Neurontin  #Alcohol abuse disorder Outpatient alcohol Anonymous, CIWA  #Tobacco abuse disorder counseled patient to quit smoking completely for 4 minutes.  Patient verbalized understanding. - nicotine patch     All the records are  reviewed and case discussed with Care Management/Social Worker. Management plans discussed with the patient, nursing and they are in agreement.  CODE STATUS: Prior  TOTAL TIME TAKING CARE OF THIS PATIENT: 35 minutes.   More than 50% of the time was spent in counseling/coordination of care: YES  POSSIBLE D/C IN 1-2 DAYS, DEPENDING ON CLINICAL CONDITION.   Delfino LovettVipul Marleena Shubert M.D on 06/10/2019 at 1:48 PM  Between 7am to 6pm - Pager - [phone]  After 6pm go to  [email or web address] - Scientist, research (life sciences)password EPAS [facility]  [facility]  Office  [phone]  CC: Primary care physician; [facility]  Note: This dictation was prepared with Dragon dictation along with smaller phrase technology. Any transcriptional errors that result from this process are unintentional.

## 2019-06-10 NOTE — Progress Notes (Signed)
Inpatient Diabetes Program Recommendations  AACE/ADA: New Consensus Statement on Inpatient Glycemic Control (2015)  Target Ranges:  Prepandial:   less than 140 mg/dL      Peak postprandial:   less than 180 mg/dL (1-2 hours)      Critically ill patients:  140 - 180 mg/dL   Lab Results  Component Value Date   GLUCAP 278 (H) 06/10/2019   HGBA1C 8.9 (H) 06/09/2019    Review of Glycemic Control Results for Yvonne Torres, Yvonne Torres (MRN [id number]) as of 06/10/2019 10:42  Ref. Range 06/09/2019 23:26 06/10/2019 07:37  Glucose-Capillary Latest Ref Range: 70 - 99 mg/dL 227 (H) 278 (H)   Diabetes history: DM2 Outpatient Diabetes medications: Tradjenta 5 mg Current orders for Inpatient glycemic control: Tradjenta 5 mg + Novolog sensitive correction tid + hs 0-5 units  Inpatient Diabetes Program Recommendations:   While in the hospital, please consider: -D/C Tradjenta -Lantus 20 units daily (0.2 units/kg x 101.6 kg)  Thank you, Nani Gasser. Fredi Hurtado, RN, MSN, CDE  Diabetes Coordinator Inpatient Glycemic Control Team Team Pager [phone] (8am-5pm) 06/10/2019 10:47 AM

## 2019-06-10 NOTE — Progress Notes (Signed)
PIV consult: US guided IV placed. Pt not receptive to midline discussion at this time. PIV teaching reviewed, pt voiced understanding of reportable symptoms.

## 2019-06-10 NOTE — Progress Notes (Signed)
Patient's fever increased to 103, after Tylenol adminstration. Contacted Rufina Falco, NP. New orders placed to give NS bolus.

## 2019-06-11 LAB — BASIC METABOLIC PANEL
Anion gap: 6 (ref 5–15)
BUN: 11 mg/dL (ref 6–20)
CO2: 22 mmol/L (ref 22–32)
Calcium: 8.2 mg/dL — ABNORMAL LOW (ref 8.9–10.3)
Chloride: 108 mmol/L (ref 98–111)
Creatinine, Ser: 0.81 mg/dL (ref 0.44–1.00)
GFR calc Af Amer: >60 mL/min (ref >60)
GFR calc non Af Amer: >60 mL/min (ref >60)
Glucose, Bld: 210 mg/dL — ABNORMAL HIGH (ref 70–99)
Potassium: 3.4 mmol/L — ABNORMAL LOW (ref 3.5–5.1)
Sodium: 136 mmol/L (ref 135–145)

## 2019-06-11 LAB — CBC
HCT: 31.8 % — ABNORMAL LOW (ref 36.0–46.0)
Hemoglobin: 10 g/dL — ABNORMAL LOW (ref 12.0–15.0)
MCH: 26.2 pg (ref 26.0–34.0)
MCHC: 31.4 g/dL (ref 30.0–36.0)
MCV: 83.2 fL (ref 80.0–100.0)
Platelets: 131 10*3/uL — ABNORMAL LOW (ref 150–400)
RBC: 3.82 MIL/uL — ABNORMAL LOW (ref 3.87–5.11)
RDW: 15.4 % (ref 11.5–15.5)
WBC: 5.4 10*3/uL (ref 4.0–10.5)
nRBC: 0 % (ref 0.0–0.2)

## 2019-06-11 LAB — GLUCOSE, CAPILLARY
Glucose-Capillary: 192 mg/dL — ABNORMAL HIGH (ref 70–99)
Glucose-Capillary: 214 mg/dL — ABNORMAL HIGH (ref 70–99)
Glucose-Capillary: 179 mg/dL — ABNORMAL HIGH (ref 70–99)
Glucose-Capillary: 206 mg/dL — ABNORMAL HIGH (ref 70–99)

## 2019-06-11 NOTE — Progress Notes (Signed)
[place] at [place] NAME: Yvonne Torres    MR#:  [id number]  DATE OF BIRTH:  1971-03-21  SUBJECTIVE:  CHIEF COMPLAINT:   Chief Complaint  Patient presents with  . Abdominal Pain  . Fever  Slowly improving.  Feels diabetes is contributing to a lot of her symptoms, afebrile REVIEW OF SYSTEMS:  Review of Systems  Constitutional: Negative for diaphoresis, fever, malaise/fatigue and weight loss.  HENT: Negative for ear discharge, ear pain, hearing loss, nosebleeds, sore throat and tinnitus.   Eyes: Negative for blurred vision and pain.  Respiratory: Negative for cough, hemoptysis, shortness of breath and wheezing.   Cardiovascular: Negative for chest pain, palpitations, orthopnea and leg swelling.  Gastrointestinal: Positive for abdominal pain. Negative for blood in stool, constipation, diarrhea, heartburn, nausea and vomiting.  Genitourinary: Negative for dysuria, frequency and urgency.  Musculoskeletal: Negative for back pain and myalgias.  Skin: Negative for itching and rash.  Neurological: Negative for dizziness, tingling, tremors, focal weakness, seizures, weakness and headaches.  Psychiatric/Behavioral: Negative for depression. The patient is not nervous/anxious.     DRUG ALLERGIES:   Allergies  Allergen Reactions  . Penicillins   . Red Dye Rash  . Tessalon [Benzonatate] Rash   VITALS:  Blood pressure (!) 103/54, pulse 79, temperature 97.6 F (36.4 C), temperature source Oral, resp. rate 18, height 5\' 4"  (1.626 m), weight 101.6 kg, last menstrual period 06/02/2019, SpO2 93 %. PHYSICAL EXAMINATION:  Physical Exam HENT:     Head: Normocephalic and atraumatic.  Eyes:     Conjunctiva/sclera: Conjunctivae normal.     Pupils: Pupils are equal, round, and reactive to light.  Neck:     Musculoskeletal: Normal range of motion and neck supple.     Thyroid: No thyromegaly.     Trachea: No tracheal deviation.   Cardiovascular:     Rate and Rhythm: Normal rate and regular rhythm.     Heart sounds: Normal heart sounds.  Pulmonary:     Effort: Pulmonary effort is normal. No respiratory distress.     Breath sounds: Normal breath sounds. No wheezing.  Chest:     Chest wall: No tenderness.  Abdominal:     General: Bowel sounds are normal. There is no distension.     Palpations: Abdomen is soft.     Tenderness: There is no abdominal tenderness.  Musculoskeletal: Normal range of motion.  Skin:    General: Skin is warm and dry.     Findings: No rash.  Neurological:     Mental Status: She is alert and oriented to person, place, and time.     Cranial Nerves: No cranial nerve deficit.    LABORATORY PANEL:  Female CBC Recent Labs  Lab 06/11/19 0354  WBC 5.4  HGB 10.0*  HCT 31.8*  PLT 131*   ------------------------------------------------------------------------------------------------------------------ Chemistries  Recent Labs  Lab 06/09/19 1927 06/09/19 2148 06/11/19 0354  NA 134* 135 136  K 3.5 3.7 3.4*  CL 104 101 108  CO2 22 23 22   GLUCOSE 176* 324* 210*  BUN 9 11 11   CREATININE 0.85 1.10* 0.81  CALCIUM 8.0* 8.2* 8.2*  MG 1.5*  --   --   AST 29 30  --   ALT 36 35  --   ALKPHOS 57 56  --   BILITOT 0.7 0.7  --    RADIOLOGY:  No results found. ASSESSMENT AND PLAN:  48 year old female with known  history of DM, bipolar disorder, opiate dependence and glaucoma presenting with complaints of right lower quadrant abdominal pain associated with nausea, urinary frequency and dysuria.  found to meet sepsis criteria secondary to acute pyelonephritis  #Sepsis from acute pyelonephritis -present on admission Continue IV fluids, IV Rocephin  urine culture growing gram-negative rods Pain management as needed  #Diabetes mellitus type 2 -D/C Tradjenta -Lantus 20 units daily -Sliding scale -She would like to see endocrinology at discharge, she thinks she may need insulin  #Bipolar  disorder continue home medication Lamictal and risperidone  #Diabetic neuropathy Neurontin  #Alcohol abuse disorder Outpatient alcohol Anonymous, CIWA  #Tobacco abuse disorder counseled patient to quit smoking completely for 4 minutes.  Patient verbalized understanding. - nicotine patch     All the records are reviewed and case discussed with Care Management/Social Worker. Management plans discussed with the patient, nursing and they are in agreement.  CODE STATUS: Prior  TOTAL TIME TAKING CARE OF THIS PATIENT: 35 minutes.   More than 50% of the time was spent in counseling/coordination of care: YES  POSSIBLE D/C IN 1 DAYS, DEPENDING ON CLINICAL CONDITION.   Delfino Lovett M.D on 06/11/2019 at 10:40 AM  Between 7am to 6pm - Pager - [phone]  After 6pm go to [email or web address] - Scientist, research (life sciences) [place] [facility]  Office  [phone]  CC: Primary care physician; Center, [facility]  Note: This dictation was prepared with Dragon dictation along with smaller phrase technology. Any transcriptional errors that result from this process are unintentional.

## 2019-06-12 LAB — URINE CULTURE: Culture: >=100000 — AB

## 2019-06-12 LAB — CBC
HCT: 31.6 % — ABNORMAL LOW (ref 36.0–46.0)
Hemoglobin: 10.1 g/dL — ABNORMAL LOW (ref 12.0–15.0)
MCH: 26.2 pg (ref 26.0–34.0)
MCHC: 32 g/dL (ref 30.0–36.0)
MCV: 82.1 fL (ref 80.0–100.0)
Platelets: 144 10*3/uL — ABNORMAL LOW (ref 150–400)
RBC: 3.85 MIL/uL — ABNORMAL LOW (ref 3.87–5.11)
RDW: 15.1 % (ref 11.5–15.5)
WBC: 5 10*3/uL (ref 4.0–10.5)
nRBC: 0 % (ref 0.0–0.2)

## 2019-06-12 LAB — BASIC METABOLIC PANEL
Anion gap: 8 (ref 5–15)
BUN: 14 mg/dL (ref 6–20)
CO2: 22 mmol/L (ref 22–32)
Calcium: 8.6 mg/dL — ABNORMAL LOW (ref 8.9–10.3)
Chloride: 109 mmol/L (ref 98–111)
Creatinine, Ser: 0.73 mg/dL (ref 0.44–1.00)
GFR calc Af Amer: >60 mL/min (ref >60)
GFR calc non Af Amer: >60 mL/min (ref >60)
Glucose, Bld: 164 mg/dL — ABNORMAL HIGH (ref 70–99)
Potassium: 3.7 mmol/L (ref 3.5–5.1)
Sodium: 139 mmol/L (ref 135–145)

## 2019-06-12 LAB — GLUCOSE, CAPILLARY: Glucose-Capillary: 143 mg/dL — ABNORMAL HIGH (ref 70–99)

## 2019-06-12 MED ORDER — CEPHALEXIN 250 MG PO CAPS: 250.0000 mg | ORAL_CAPSULE | Freq: Two times a day (BID) | ORAL | 0 refills | Status: AC

## 2019-06-12 NOTE — Discharge Instructions (Signed)
Urinary Tract Infection, Adult A urinary tract infection (UTI) is an infection of any part of the urinary tract. The urinary tract includes:  The kidneys.  The ureters.  The bladder.  The urethra. These organs make, store, and get rid of pee (urine) in the body. What are the causes? This is caused by germs (bacteria) in your genital area. These germs grow and cause swelling (inflammation) of your urinary tract. What increases the risk? You are more likely to develop this condition if:  You have a small, thin tube (catheter) to drain pee.  You cannot control when you pee or poop (incontinence).  You are female, and: ? You use these methods to prevent pregnancy: ? A medicine that kills sperm (spermicide). ? A device that blocks sperm (diaphragm). ? You have low levels of a female hormone (estrogen). ? You are pregnant.  You have genes that add to your risk.  You are sexually active.  You take antibiotic medicines.  You have trouble peeing because of: ? A prostate that is bigger than normal, if you are female. ? A blockage in the part of your body that drains pee from the bladder (urethra). ? A kidney stone. ? A nerve condition that affects your bladder (neurogenic bladder). ? Not getting enough to drink. ? Not peeing often enough.  You have other conditions, such as: ? Diabetes. ? A weak disease-fighting system (immune system). ? Sickle cell disease. ? Gout. ? Injury of the spine. What are the signs or symptoms? Symptoms of this condition include:  Needing to pee right away (urgently).  Peeing often.  Peeing small amounts often.  Pain or burning when peeing.  Blood in the pee.  Pee that smells bad or not like normal.  Trouble peeing.  Pee that is cloudy.  Fluid coming from the vagina, if you are female.  Pain in the belly or lower back. Other symptoms include:  Throwing up (vomiting).  No urge to eat.  Feeling mixed up (confused).  Being tired  and grouchy (irritable).  A fever.  Watery poop (diarrhea). How is this treated? This condition may be treated with:  Antibiotic medicine.  Other medicines.  Drinking enough water. Follow these instructions at home:  Medicines  Take over-the-counter and prescription medicines only as told by your doctor.  If you were prescribed an antibiotic medicine, take it as told by your doctor. Do not stop taking it even if you start to feel better. General instructions  Make sure you: ? Pee until your bladder is empty. ? Do not hold pee for a long time. ? Empty your bladder after sex. ? Wipe from front to back after pooping if you are a female. Use each tissue one time when you wipe.  Drink enough fluid to keep your pee pale yellow.  Keep all follow-up visits as told by your doctor. This is important. Contact a doctor if:  You do not get better after 1-2 days.  Your symptoms go away and then come back. Get help right away if:  You have very bad back pain.  You have very bad pain in your lower belly.  You have a fever.  You are sick to your stomach (nauseous).  You are throwing up. Summary  A urinary tract infection (UTI) is an infection of any part of the urinary tract.  This condition is caused by germs in your genital area.  There are many risk factors for a UTI. These include having a small, thin   tube to drain pee and not being able to control when you pee or poop.  Treatment includes antibiotic medicines for germs.  Drink enough fluid to keep your pee pale yellow. This information is not intended to replace advice given to you by your health care provider. Make sure you discuss any questions you have with your health care provider. Document Released: 02/13/2008 Document Revised: 08/14/2018 Document Reviewed: 03/06/2018 Elsevier Patient Education  2020 Elsevier Inc.  

## 2019-06-12 NOTE — Progress Notes (Signed)
Yvonne Torres to be D/C'd home per MD order.  Discussed prescriptions and follow up appointments with the patient. Prescriptions given to patient, medication list explained in detail. Pt verbalized understanding.  Allergies as of 06/12/2019       Reactions   Penicillins    Red Dye Rash   Tessalon [benzonatate] Rash        Medication List     STOP taking these medications    hydrOXYzine 25 MG tablet Commonly known as: ATARAX/VISTARIL       TAKE these medications    blood glucose meter kit and supplies Dispense based on patient and insurance preference. Use up to four times daily as directed. (FOR ICD-10 E10.9, E11.9).   cephALEXin 250 MG capsule Commonly known as: KEFLEX Take 1 capsule (250 mg total) by mouth 2 (two) times daily for 2 days.   EPINEPHrine 0.3 mg/0.3 mL Soaj injection Commonly known as: EPI-PEN Inject 0.3 mLs (0.3 mg total) into the muscle once. Follow package instructions as needed for severe allergy or anaphylactic reaction.   gabapentin 800 MG tablet Commonly known as: NEURONTIN Take 1 tablet (800 mg total) by mouth 3 (three) times daily.   lamoTRIgine 150 MG tablet Commonly known as: LaMICtal Take 1 tablet (150 mg total) by mouth daily.   linagliptin 5 MG Tabs tablet Commonly known as: TRADJENTA Take 1 tablet (5 mg total) by mouth daily.   nicotine 21 mg/24hr patch Commonly known as: NICODERM CQ - dosed in mg/24 hours Place 1 patch (21 mg total) onto the skin daily.   prazosin 2 MG capsule Commonly known as: MINIPRESS Take 1 capsule (2 mg total) by mouth at bedtime.   risperiDONE 2 MG tablet Commonly known as: RISPERDAL Take 2 mg by mouth daily.        Vitals:   06/11/19 2000 06/12/19 0346  BP: 137/71 (!) 142/95  Pulse: 92 90  Resp: 19   Temp: 98.3 F (36.8 C) (!) 97.5 F (36.4 C)  SpO2: 99% 98%    Skin clean, dry and intact without evidence of skin break down, no evidence of skin tears noted. IV catheter  discontinued intact. Site without signs and symptoms of complications. Dressing and pressure applied. Pt denies pain at this time. No complaints noted.  An After Visit Summary was printed and given to the patient. Patient escorted via [place], and D/C home via private auto.  Yvonne Torres

## 2019-06-12 NOTE — Discharge Summary (Signed)
[facility] [place] NAME: Yvonne Torres    MR#:  [id number]  DATE OF BIRTH:  October 28, 1970  DATE OF ADMISSION:  06/09/2019   ADMITTING PHYSICIAN: Nicholes Mango, MD  DATE OF DISCHARGE: 06/12/2019 10:55 AM  PRIMARY CARE PHYSICIAN: [facility]   ADMISSION DIAGNOSIS:  Pyelonephritis [N12] DISCHARGE DIAGNOSIS:  Active Problems:   Sepsis ([place])  SECONDARY DIAGNOSIS:   Past Medical History:  Diagnosis Date  . Bipolar 1 disorder ([place])   . Diabetes mellitus without complication ([place])   . Glaucoma, narrow-angle 2015  . Opioid dependence in remission [facility]) 2019   Reports she went through residential treatment program and is clean as of June 2019   HOSPITAL COURSE:  48 year old female with known history of DM, bipolar disorder, opiate dependence and glaucoma admitted for right lower quadrant abdominal pain associated with nausea, urinary frequency and dysuria. found to meet sepsis criteria secondary to acute pyelonephritis  #Sepsis from acute pyelonephritis -present on admission Urine c/s grew e.coli  #Diabetes mellitus type 2 - managed with SSI while in the Hospital -She would like to see endocrinology at discharge, she thinks she may need insulin  #Bipolar disorder continue home medication Lamictal and risperidone  #Diabetic neuropathy Neurontin  #Alcohol abuse disorder Outpatient alcohol Anonymous, [facility]  #Tobacco abuse disorder counseled patient to quit smoking completely for 4 minutes. Patient verbalized understanding. - nicotine patch   DISCHARGE CONDITIONS:  stable CONSULTS OBTAINED:   DRUG ALLERGIES:   Allergies  Allergen Reactions  . Penicillins   . Red Dye Rash  . Tessalon [Benzonatate] Rash   DISCHARGE MEDICATIONS:   Allergies as of 06/12/2019      Reactions   Penicillins    Red Dye Rash   Tessalon [benzonatate] Rash      Medication List    STOP taking these medications    hydrOXYzine 25 MG tablet Commonly known as: ATARAX/VISTARIL     TAKE these medications   blood glucose meter kit and supplies Dispense based on patient and insurance preference. Use up to four times daily as directed. (FOR ICD-10 E10.9, E11.9).   cephALEXin 250 MG capsule Commonly known as: KEFLEX Take 1 capsule (250 mg total) by mouth 2 (two) times daily for 2 days.   EPINEPHrine 0.3 mg/0.3 mL Soaj injection Commonly known as: EPI-PEN Inject 0.3 mLs (0.3 mg total) into the muscle once. Follow package instructions as needed for severe allergy or anaphylactic reaction.   gabapentin 800 MG tablet Commonly known as: NEURONTIN Take 1 tablet (800 mg total) by mouth 3 (three) times daily.   lamoTRIgine 150 MG tablet Commonly known as: LaMICtal Take 1 tablet (150 mg total) by mouth daily.   linagliptin 5 MG Tabs tablet Commonly known as: TRADJENTA Take 1 tablet (5 mg total) by mouth daily.   nicotine 21 mg/24hr patch Commonly known as: NICODERM CQ - dosed in mg/24 hours Place 1 patch (21 mg total) onto the skin daily.   prazosin 2 MG capsule Commonly known as: MINIPRESS Take 1 capsule (2 mg total) by mouth at bedtime.   risperiDONE 2 MG tablet Commonly known as: RISPERDAL Take 2 mg by mouth daily.      DISCHARGE INSTRUCTIONS:   DIET:  Dianetic diet DISCHARGE CONDITION:  Good ACTIVITY:  Activity as tolerated OXYGEN:  Home Oxygen: No.  Oxygen Delivery: room air DISCHARGE LOCATION:  home   If you experience worsening of your admission symptoms, develop shortness of breath, life threatening  emergency, suicidal or homicidal thoughts you must seek medical attention immediately by calling 911 or calling your MD immediately  if symptoms less severe.  You Must read complete instructions/literature along with all the possible adverse reactions/side effects for all the Medicines you take and that have been prescribed to you. Take any new Medicines after you have completely  understood and accpet all the possible adverse reactions/side effects.   Please note  You were cared for by a hospitalist during your hospital stay. If you have any questions about your discharge medications or the care you received while you were in the hospital after you are discharged, you can call the unit and asked to speak with the hospitalist on call if the hospitalist that took care of you is not available. Once you are discharged, your primary care physician will handle any further medical issues. Please note that NO REFILLS for any discharge medications will be authorized once you are discharged, as it is imperative that you return to your primary care physician (or establish a relationship with a primary care physician if you do not have one) for your aftercare needs so that they can reassess your need for medications and monitor your lab values.    On the day of Discharge:  VITAL SIGNS:  Blood pressure (!) 142/95, pulse 90, temperature (!) 97.5 F (36.4 C), temperature source Oral, resp. rate 19, height 5' 4"  (1.626 m), weight 101.6 kg, last menstrual period 06/02/2019, SpO2 98 %. PHYSICAL EXAMINATION:  GENERAL:  48 y.o.-year-old patient lying in the bed with no acute distress.  EYES: Pupils equal, round, reactive to light and accommodation. No scleral icterus. Extraocular muscles intact.  HEENT: Head atraumatic, normocephalic. Oropharynx and nasopharynx clear.  NECK:  Supple, no jugular venous distention. No thyroid enlargement, no tenderness.  LUNGS: Normal breath sounds bilaterally, no wheezing, rales,rhonchi or crepitation. No use of accessory muscles of respiration.  CARDIOVASCULAR: S1, S2 normal. No murmurs, rubs, or gallops.  ABDOMEN: Soft, non-tender, non-distended. Bowel sounds present. No organomegaly or mass.  EXTREMITIES: No pedal edema, cyanosis, or clubbing.  NEUROLOGIC: Cranial nerves II through XII are intact. Muscle strength 5/5 in all extremities. Sensation intact.  Gait not checked.  PSYCHIATRIC: The patient is alert and oriented x 3.  SKIN: No obvious rash, lesion, or ulcer.  DATA REVIEW:   CBC Recent Labs  Lab 06/12/19 0428  WBC 5.0  HGB 10.1*  HCT 31.6*  PLT 144*    Chemistries  Recent Labs  Lab 06/09/19 1927 06/09/19 2148  06/12/19 0428  NA 134* 135   < > 139  K 3.5 3.7   < > 3.7  CL 104 101   < > 109  CO2 22 23   < > 22  GLUCOSE 176* 324*   < > 164*  BUN 9 11   < > 14  CREATININE 0.85 1.10*   < > 0.73  CALCIUM 8.0* 8.2*   < > 8.6*  MG 1.5*  --   --   --   AST 29 30  --   --   ALT 36 35  --   --   ALKPHOS 57 56  --   --   BILITOT 0.7 0.7  --   --    < > = values in this interval not displayed.     Microbiology Results   Urine c/s grew e.coli  Management plans discussed with the patient, family and they are in agreement.  CODE STATUS: Prior  TOTAL TIME TAKING CARE OF THIS PATIENT: 45 minutes.    Max Sane M.D on 06/12/2019 at 7:02 PM  Between 7am to 6pm - Pager - [phone]  After 6pm go to [email or web address] - [employer] [place] [facility]  Office  [phone]  CC: Primary care physician; [facility]   Note: This dictation was prepared with Dragon dictation along with smaller phrase technology. Any transcriptional errors that result from this process are unintentional.

## 2019-06-14 LAB — CULTURE, BLOOD (ROUTINE X 2)
Culture: NO GROWTH
Culture: NO GROWTH
Special Requests: ADEQUATE
Special Requests: ADEQUATE

## 2020-03-08 ENCOUNTER — Other Ambulatory Visit: Payer: Self-pay

## 2020-03-08 ENCOUNTER — Emergency Department: Payer: Self-pay

## 2020-03-08 ENCOUNTER — Emergency Department
Admission: EM | Admit: 2020-03-08 | Discharge: 2020-03-08 | Disposition: A | Discharge status: Home / Self Care | Payer: Self-pay | Attending: Emergency Medicine | Admitting: Emergency Medicine

## 2020-03-08 ENCOUNTER — Encounter: Payer: Self-pay | Admitting: Emergency Medicine

## 2020-03-08 DIAGNOSIS — M545 Low back pain, unspecified: Secondary | ICD-10-CM

## 2020-03-08 DIAGNOSIS — F17210 Nicotine dependence, cigarettes, uncomplicated: Secondary | ICD-10-CM | POA: Insufficient documentation

## 2020-03-08 DIAGNOSIS — E119 Type 2 diabetes mellitus without complications: Secondary | ICD-10-CM | POA: Insufficient documentation

## 2020-03-08 DIAGNOSIS — R109 Unspecified abdominal pain: Secondary | ICD-10-CM | POA: Insufficient documentation

## 2020-03-08 LAB — URINALYSIS, COMPLETE (UACMP) WITH MICROSCOPIC
Bacteria, UA: NONE SEEN
Bilirubin Urine: NEGATIVE
Glucose, UA: >=500 mg/dL — AB
Hgb urine dipstick: NEGATIVE
Ketones, ur: 20 mg/dL — AB
Leukocytes,Ua: NEGATIVE
Nitrite: NEGATIVE
Protein, ur: NEGATIVE mg/dL
Specific Gravity, Urine: 1.03 (ref 1.005–1.030)
pH: 5 (ref 5.0–8.0)

## 2020-03-08 LAB — POCT PREGNANCY, URINE: Preg Test, Ur: NEGATIVE

## 2020-03-08 LAB — COMPREHENSIVE METABOLIC PANEL
ALT: 54 U/L — ABNORMAL HIGH (ref 0–44)
AST: 37 U/L (ref 15–41)
Albumin: 4.4 g/dL (ref 3.5–5.0)
Alkaline Phosphatase: 77 U/L (ref 38–126)
Anion gap: 10 (ref 5–15)
BUN: 19 mg/dL (ref 6–20)
CO2: 20 mmol/L — ABNORMAL LOW (ref 22–32)
Calcium: 9.4 mg/dL (ref 8.9–10.3)
Chloride: 104 mmol/L (ref 98–111)
Creatinine, Ser: 0.82 mg/dL (ref 0.44–1.00)
GFR calc Af Amer: >60 mL/min (ref >60)
GFR calc non Af Amer: >60 mL/min (ref >60)
Glucose, Bld: 273 mg/dL — ABNORMAL HIGH (ref 70–99)
Potassium: 4.3 mmol/L (ref 3.5–5.1)
Sodium: 134 mmol/L — ABNORMAL LOW (ref 135–145)
Total Bilirubin: 0.6 mg/dL (ref 0.3–1.2)
Total Protein: 7.6 g/dL (ref 6.5–8.1)

## 2020-03-08 LAB — CBC
HCT: 38.4 % (ref 36.0–46.0)
Hemoglobin: 12.9 g/dL (ref 12.0–15.0)
MCH: 26.5 pg (ref 26.0–34.0)
MCHC: 33.6 g/dL (ref 30.0–36.0)
MCV: 78.9 fL — ABNORMAL LOW (ref 80.0–100.0)
Platelets: 246 10*3/uL (ref 150–400)
RBC: 4.87 MIL/uL (ref 3.87–5.11)
RDW: 16.5 % — ABNORMAL HIGH (ref 11.5–15.5)
WBC: 8.5 10*3/uL (ref 4.0–10.5)
nRBC: 0 % (ref 0.0–0.2)

## 2020-03-08 LAB — LIPASE, BLOOD: Lipase: 41 U/L (ref 11–51)

## 2020-03-08 MED ORDER — ONDANSETRON HCL 4 MG/2ML IJ SOLN
4.0000 mg | Freq: Once | INTRAMUSCULAR | Status: AC
  Administered 2020-03-08: 4 mg via INTRAVENOUS
  Filled 2020-03-08: qty 2

## 2020-03-08 MED ORDER — NAPROXEN 500 MG PO TABS: 500.0000 mg | ORAL_TABLET | Freq: Two times a day (BID) | ORAL | 2 refills | Status: AC

## 2020-03-08 MED ORDER — SODIUM CHLORIDE 0.9 % IV SOLN
1000.0000 mL | Freq: Once | INTRAVENOUS | Status: AC
  Administered 2020-03-08: 1000 mL via INTRAVENOUS

## 2020-03-08 MED ORDER — FLUCONAZOLE 150 MG PO TABS: 150.0000 mg | ORAL_TABLET | Freq: Every day | ORAL | 0 refills | Status: AC

## 2020-03-08 MED ORDER — NITROFURANTOIN MONOHYD MACRO 100 MG PO CAPS: 100.0000 mg | ORAL_CAPSULE | Freq: Two times a day (BID) | ORAL | 0 refills | Status: AC

## 2020-03-08 MED ORDER — KETOROLAC TROMETHAMINE 30 MG/ML IJ SOLN
30.0000 mg | Freq: Once | INTRAMUSCULAR | Status: AC
  Administered 2020-03-08: 30 mg via INTRAVENOUS
  Filled 2020-03-08: qty 1

## 2020-03-08 NOTE — ED Triage Notes (Signed)
Pt reports lower back pain since yesterday and some urinary frequency for the past few weeks. Pt also reports a sharp pain when she urinates.

## 2020-03-08 NOTE — ED Triage Notes (Signed)
Pt states she does not want any narcotics or benzos at all. Requested that I make a note to let MD know she does not want anything like that due to her hx

## 2020-03-08 NOTE — ED Provider Notes (Signed)
[facility] Emergency Department Provider Note   ____________________________________________    I have reviewed the triage vital signs and the nursing notes.   HISTORY  Chief Complaint Back Pain and Abdominal Pain     HPI Yvonne Torres is a 49 y.o. female with history of diabetes who presents with complaints of right lower back pain x1 day.  She reports pain radiates from her right lower back to her left lower back.  She denies fevers or chills.  She is concerned because in the past she had similar pains and put it off and was found to have kidney abscess from an infection.  She took Motrin earlier today.  Some urinary frequency.  No vomiting.  Normal stools.  No history of kidney stones  Past Medical History:  Diagnosis Date  . Bipolar 1 disorder ([place])   . Diabetes mellitus without complication ([place])   . Glaucoma, narrow-angle 2015  . Opioid dependence in remission [facility]) 2019   Reports she went through residential treatment program and is clean as of June 2019    Patient Active Problem List   Diagnosis Date Noted  . Sepsis ([place]) 06/09/2019  . Anxiety 06/23/2018  . Depression 06/23/2018  . Use of nonprescription opiate drugs ([place]) 04/23/2018  . Bipolar I disorder, current or most recent episode manic, with psychotic features ([place]) 04/22/2018  . Alcohol use disorder, moderate, dependence ([place]) 04/22/2018  . PTSD (post-traumatic stress disorder) 04/22/2018  . OCD (obsessive compulsive disorder) 04/22/2018  . Tobacco use disorder 04/22/2018  . Cocaine use disorder, moderate, dependence ([place]) 04/22/2018  . Abnormal Papanicolaou smear of cervix 08/16/2017  . Obesity 12/17/2013    Past Surgical History:  Procedure Laterality Date  . ECTOPIC PREGNANCY SURGERY    . TONSILLECTOMY      Prior to Admission medications   Medication Sig Start Date End Date Taking? Authorizing Provider  blood glucose meter kit and supplies Dispense based on  patient and insurance preference. Use up to four times daily as directed. (FOR ICD-10 E10.9, E11.9). 04/25/18   McNew, Tyson Babinski, MD  EPINEPHrine 0.3 mg/0.3 mL IJ SOAJ injection Inject 0.3 mLs (0.3 mg total) into the muscle once. Follow package instructions as needed for severe allergy or anaphylactic reaction. 09/23/15   Carrie Mew, MD  fluconazole (DIFLUCAN) 150 MG tablet Take 1 tablet (150 mg total) by mouth daily. Can take additional tablet 72 hours after the first if needed 03/08/20   Lavonia Drafts, MD  gabapentin (NEURONTIN) 800 MG tablet Take 1 tablet (800 mg total) by mouth 3 (three) times daily. 04/27/18   Lenward Chancellor, MD  lamoTRIgine (LAMICTAL) 150 MG tablet Take 1 tablet (150 mg total) by mouth daily. 06/02/18   McNew, Tyson Babinski, MD  linagliptin (TRADJENTA) 5 MG TABS tablet Take 1 tablet (5 mg total) by mouth daily. 04/26/18   McNew, Tyson Babinski, MD  naproxen (NAPROSYN) 500 MG tablet Take 1 tablet (500 mg total) by mouth 2 (two) times daily with a meal. 03/08/20   Lavonia Drafts, MD  nicotine (NICODERM CQ - DOSED IN MG/24 HOURS) 21 mg/24hr patch Place 1 patch (21 mg total) onto the skin daily. 04/26/18   McNew, Tyson Babinski, MD  nitrofurantoin, macrocrystal-monohydrate, (MACROBID) 100 MG capsule Take 1 capsule (100 mg total) by mouth 2 (two) times daily for 7 days. 03/08/20 03/15/20  Lavonia Drafts, MD  prazosin (MINIPRESS) 2 MG capsule Take 1 capsule (2 mg total) by mouth at bedtime. 04/25/18   McNew,  Tyson Babinski, MD  risperiDONE (RISPERDAL) 2 MG tablet Take 2 mg by mouth daily. 12/17/18   [provider]     Allergies Penicillins, Red dye, and Tessalon [benzonatate]  Family History  Problem Relation Age of Onset  . Breast cancer Mother 9  . Diabetes Mother   . Diabetes Father   . Heart disease Father   . Hypertension Father   . Stroke Father   . Diabetes Brother   . Depression Brother     Social History Social History   Tobacco Use  . Smoking status: Current Some Day Smoker     Packs/day: 0.25    Types: Cigarettes  . Smokeless tobacco: Never Used  Substance Use Topics  . Alcohol use: Yes  . Drug use: Not on file    Review of Systems  Constitutional: No fever/chills Eyes: No visual changes.  ENT: No sore throat. Cardiovascular: Denies chest pain. Respiratory: Denies shortness of breath. Gastrointestinal: As above Genitourinary: As above Musculoskeletal: As above Skin: Negative for rash. Neurological: Negative for headaches or weakness   ____________________________________________   PHYSICAL EXAM:  VITAL SIGNS: ED Triage Vitals  Enc Vitals Group     BP 03/08/20 0841 137/83     Pulse Rate 03/08/20 0841 (!) 104     Resp 03/08/20 0841 20     Temp 03/08/20 0841 98.9 F (37.2 C)     Temp Source 03/08/20 0841 Oral     SpO2 03/08/20 0841 97 %     Weight 03/08/20 0838 101.2 kg (223 lb)     Height 03/08/20 0838 1.626 m (5' 4")     Head Circumference --      Peak Flow --      Pain Score 03/08/20 0838 6     Pain Loc --      Pain Edu? --      Excl. in [place]? --     Constitutional: Alert and oriented.   Nose: No congestion/rhinnorhea. Mouth/Throat: Mucous membranes are moist.    Cardiovascular: Normal rate, regular rhythm. Grossly normal heart sounds.  Good peripheral circulation. Respiratory: Normal respiratory effort.  No retractions. Lungs CTAB. Gastrointestinal: Soft and nontender. No distention.  Possible mild right CVA tenderness  Musculoskeletal: No lower extremity tenderness nor edema.  Warm and well perfused.  No vertebral tenderness palpation Neurologic:  Normal speech and language. No gross focal neurologic deficits are appreciated.  Skin:  Skin is warm, dry and intact. No rash noted. Psychiatric: Mood and affect are normal. Speech and behavior are normal.  ____________________________________________   LABS (all labs ordered are listed, but only abnormal results are displayed)  Labs Reviewed  COMPREHENSIVE METABOLIC PANEL -  Abnormal; Notable for the following components:      Result Value   Sodium 134 (*)    CO2 20 (*)    Glucose, Bld 273 (*)    ALT 54 (*)    All other components within normal limits  CBC - Abnormal; Notable for the following components:   MCV 78.9 (*)    RDW 16.5 (*)    All other components within normal limits  URINALYSIS, COMPLETE (UACMP) WITH MICROSCOPIC - Abnormal; Notable for the following components:   Color, Urine YELLOW (*)    APPearance HAZY (*)    Glucose, UA >=500 (*)    Ketones, ur 20 (*)    All other components within normal limits  URINE CULTURE  LIPASE, BLOOD  POCT PREGNANCY, URINE   ____________________________________________  EKG  None ____________________________________________  RADIOLOGY  CT renal stone study is unremarkable ____________________________________________   PROCEDURES  Procedure(s) performed: No  Procedures   Critical Care performed: No ____________________________________________   INITIAL IMPRESSION / ASSESSMENT AND PLAN / ED COURSE  Pertinent labs & imaging results that were available during my care of the patient were reviewed by me and considered in my medical decision making (see chart for details).  Patient presents with right posterior back pain as described above.  Differential includes musculoskeletal pain, urinary tract infection, kidney stone.  Will treat with IV Toradol, patient has explicitly requested no narcotics, no benzos because of her history.  Will give IV fluids as well.  Obtain CT renal stone study to evaluate for kidney stone.  And reevaluate  Lab work thus far is overall reassuring, mild elevation in glucose normal anion gap, the patient is diabetic.  Lipase and CBC are essentially normal  CT renal stone study is negative for kidney stone.  Patient is feeling better after Toradol, will treat for musculoskeletal pain, Naprosyn, will add antibiotics given her history.  Return precautions discussed        ____________________________________________   FINAL CLINICAL IMPRESSION(S) / ED DIAGNOSES  Final diagnoses:  Acute right-sided low back pain without sciatica        Note:  This document was prepared using Dragon voice recognition software and may include unintentional dictation errors.   Lavonia Drafts, MD 03/08/20 1302

## 2020-03-09 LAB — URINE CULTURE: Culture: <10000 — AB

## 2021-04-13 IMAGING — CT CT RENAL STONE PROTOCOL
2 of 4 series · 16 of 46 positions shown, 18 images · non-contrast
Comparison: 06/09/2019

CLINICAL DATA: Right-sided flank pain for 2 days

EXAM:
CT ABDOMEN AND PELVIS WITHOUT CONTRAST
TECHNIQUE: Multidetector CT imaging of the abdomen and pelvis was performed
following the standard protocol without IV contrast.

[Series 2: stone full standard · axial · 0.84mm/px · z∈[-540,-90]mm · 13 of 100 slices shown, 15 images]
[im 5/100  soft-tissue]
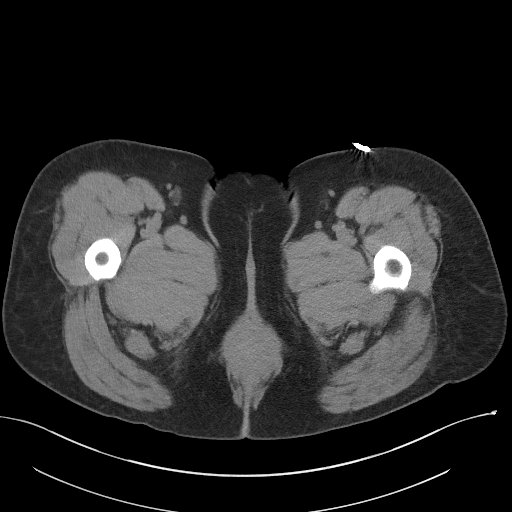
[im 5/100  bone]
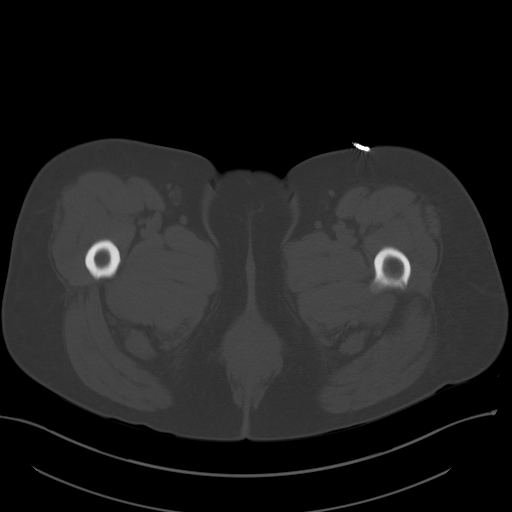
[im 13/100  soft-tissue]
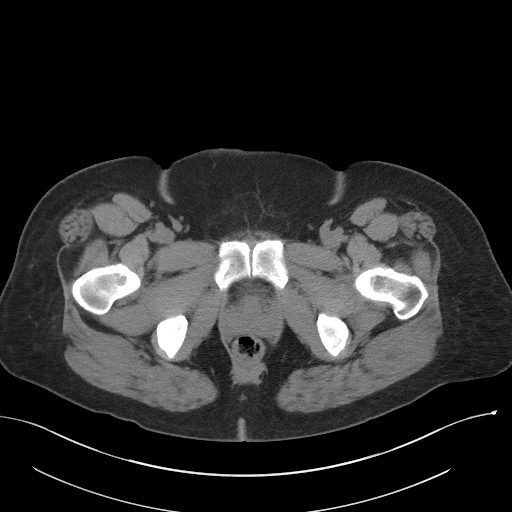
[im 21/100  soft-tissue]
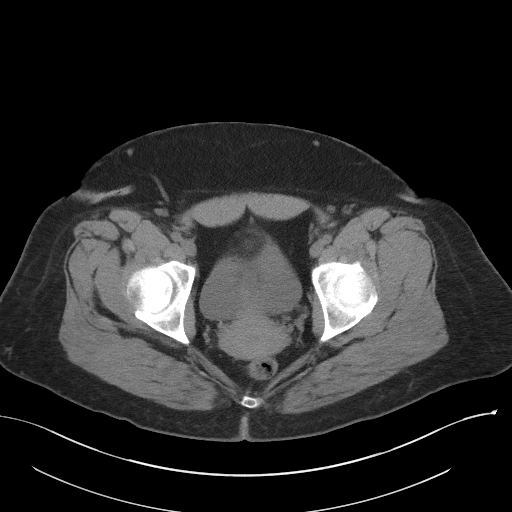
[im 29/100  soft-tissue]
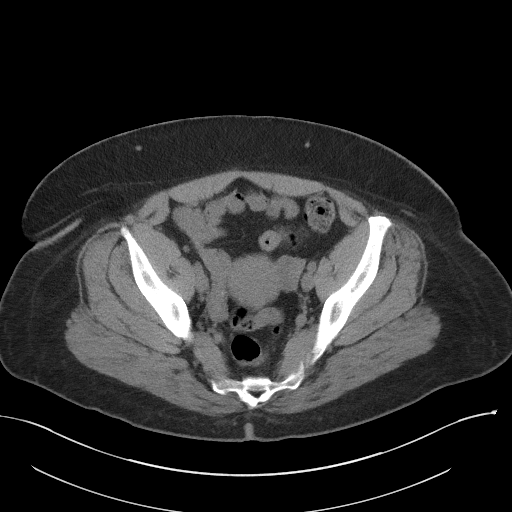
[im 34/100  soft-tissue]
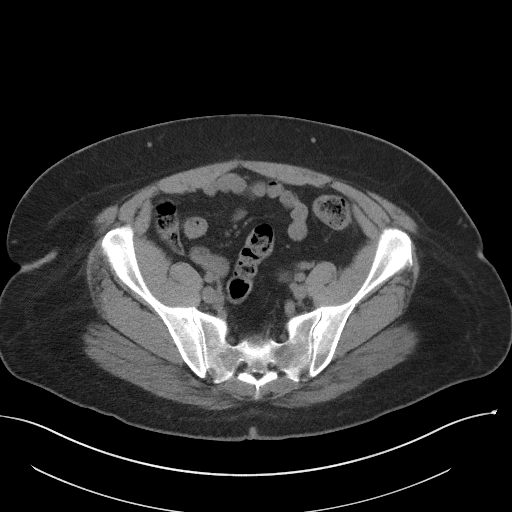
[im 42/100  soft-tissue]
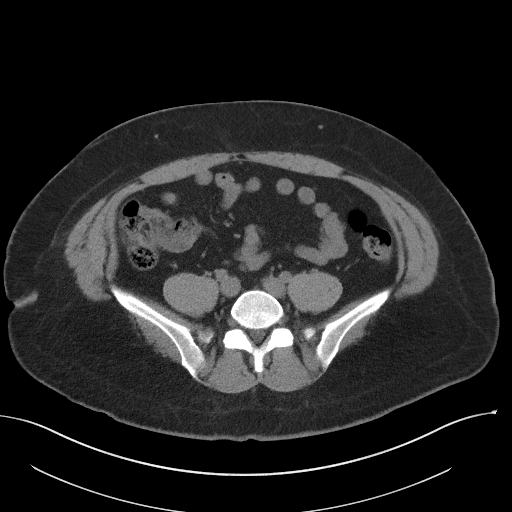
[im 50/100  soft-tissue]
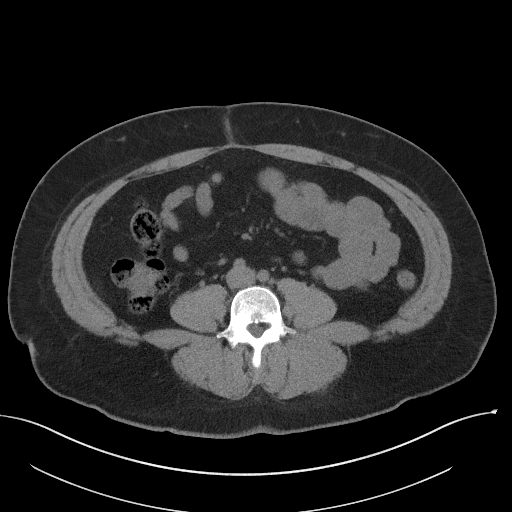
[im 58/100  soft-tissue]
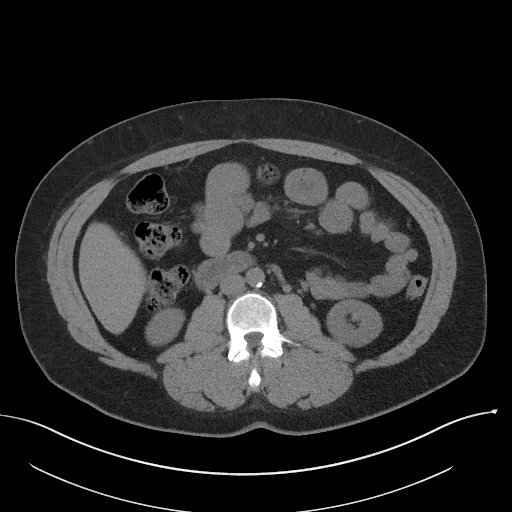
[im 67/100  soft-tissue]
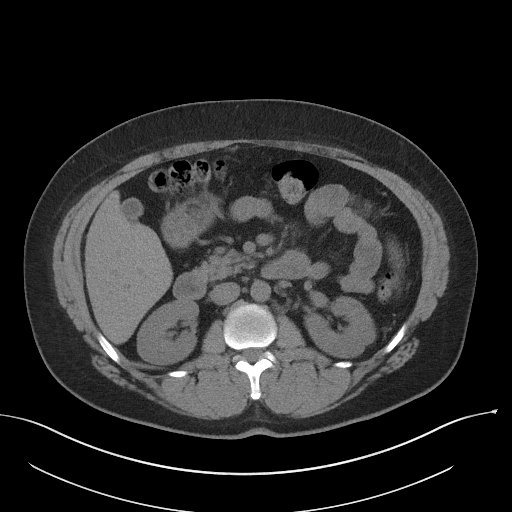
[im 67/100  bone]
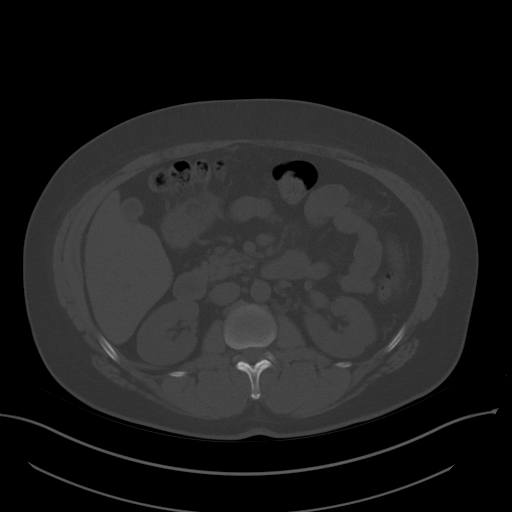
[im 71/100  soft-tissue]
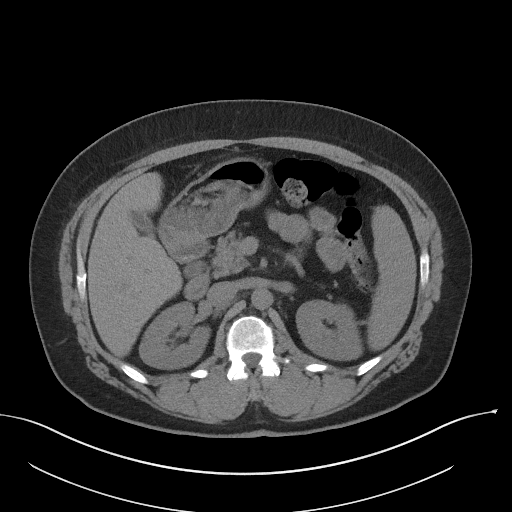
[im 79/100  soft-tissue]
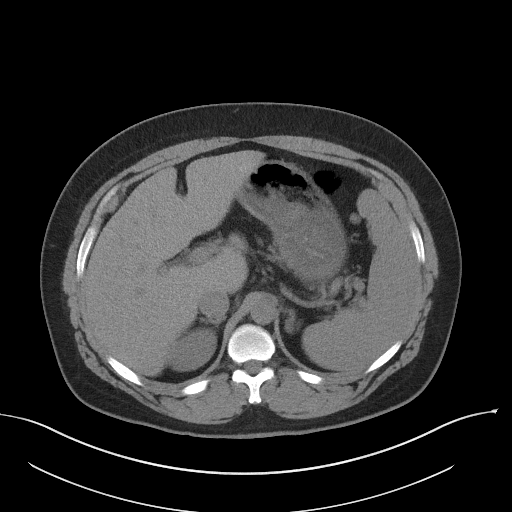
[im 87/100  soft-tissue]
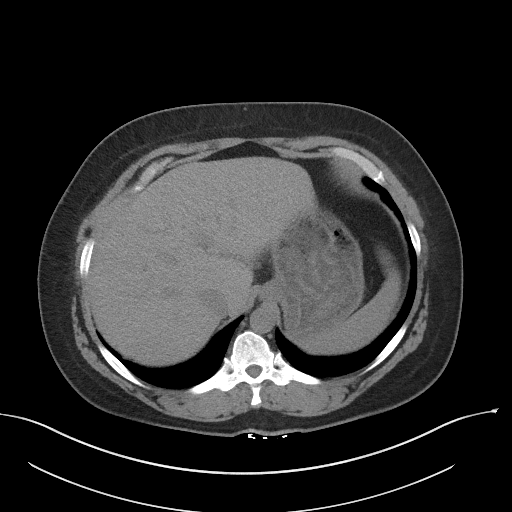
[im 95/100  soft-tissue]
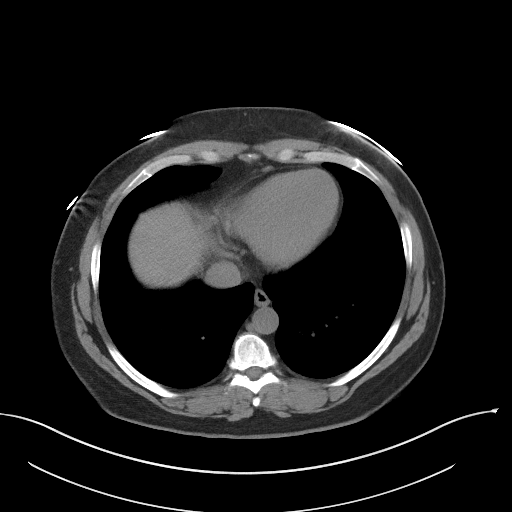

[Series 5: coronal · coronal · 0.89mm/px · 3 of 136 slices shown]
[im 46/136  soft-tissue]
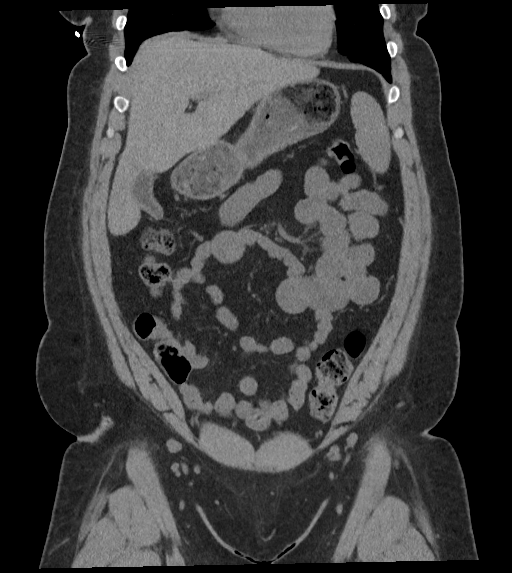
[im 61/136  soft-tissue]
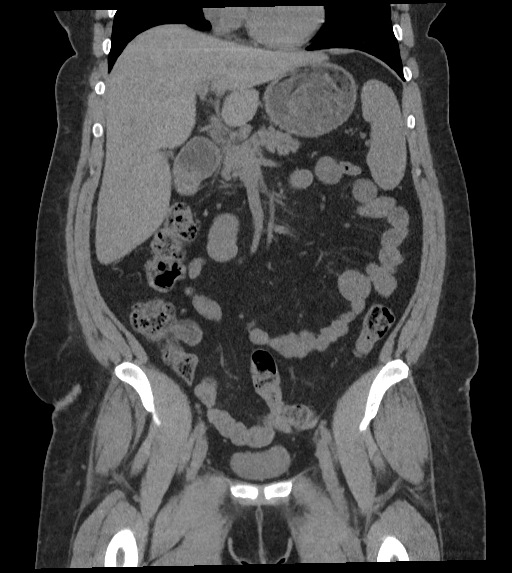
[im 76/136  soft-tissue]
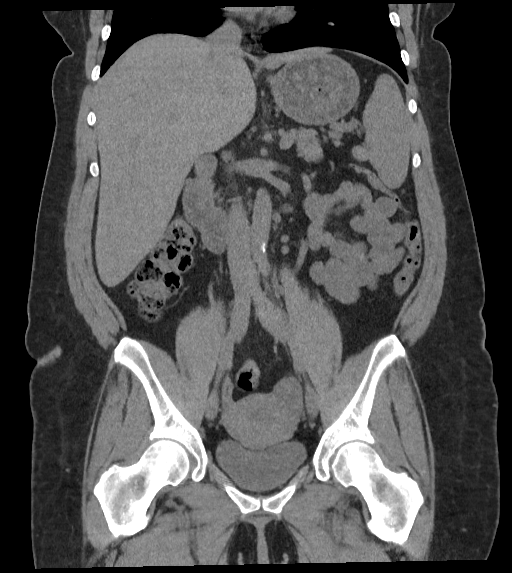

[16 of 46 positions shown; findings below may reference images not displayed]

FINDINGS: Lower chest: No acute abnormality.

Hepatobiliary: No focal liver abnormality is seen. No gallstones,
gallbladder wall thickening, or biliary dilatation.

Pancreas: Unremarkable. No pancreatic ductal dilatation or
surrounding inflammatory changes.

Spleen: Normal in size without focal abnormality.

Adrenals/Urinary Tract: Adrenal glands are within normal limits.
Kidneys demonstrate a normal appearance bilaterally. No renal
calculi or urinary tract obstructive changes are seen. Bladder is
well distended.

Stomach/Bowel: The appendix is air-filled and within normal limits.
No obstructive or inflammatory changes of the colon are seen. The
stomach is distended with ingested food stuffs. No obstructive
changes are seen. A few mildly prominent loops of proximal jejunum
are seen although no true obstructive change is noted. This may
represent a mild enteritis.

Vascular/Lymphatic: Aortic atherosclerosis. No enlarged abdominal or
pelvic lymph nodes.

Reproductive: Uterus and bilateral adnexa are unremarkable.

Other: No abdominal wall hernia or abnormality. No abdominopelvic
ascites.

Musculoskeletal: No acute or significant osseous findings.
IMPRESSION: No renal calculi or urinary tract obstructive changes are noted.

Mildly prominent proximal jejunal loops which may be related to a
degree of enteritis. No true obstructive changes seen.

No other focal abnormality is noted.
